# Patient Record
Sex: Female | Born: 1985 | Race: White | Hispanic: No | Marital: Married | Smoking: Never smoker
Health system: Southern US, Community
[De-identification: ages and names within clinical notes are randomized; demographics above are authoritative.]

## PROBLEM LIST (undated history)

## (undated) DIAGNOSIS — R110 Nausea: Secondary | ICD-10-CM

## (undated) DIAGNOSIS — F419 Anxiety disorder, unspecified: Secondary | ICD-10-CM

## (undated) DIAGNOSIS — Z9889 Other specified postprocedural states: Secondary | ICD-10-CM

## (undated) DIAGNOSIS — N939 Abnormal uterine and vaginal bleeding, unspecified: Secondary | ICD-10-CM

## (undated) DIAGNOSIS — G039 Meningitis, unspecified: Secondary | ICD-10-CM

## (undated) DIAGNOSIS — D259 Leiomyoma of uterus, unspecified: Secondary | ICD-10-CM

## (undated) DIAGNOSIS — N3010 Interstitial cystitis (chronic) without hematuria: Secondary | ICD-10-CM

## (undated) DIAGNOSIS — F411 Generalized anxiety disorder: Secondary | ICD-10-CM

## (undated) DIAGNOSIS — N8003 Adenomyosis of the uterus: Secondary | ICD-10-CM

## (undated) DIAGNOSIS — D696 Thrombocytopenia, unspecified: Secondary | ICD-10-CM

## (undated) DIAGNOSIS — M797 Fibromyalgia: Secondary | ICD-10-CM

## (undated) DIAGNOSIS — R102 Pelvic and perineal pain: Secondary | ICD-10-CM

## (undated) DIAGNOSIS — D649 Anemia, unspecified: Secondary | ICD-10-CM

## (undated) DIAGNOSIS — F32A Depression, unspecified: Secondary | ICD-10-CM

## (undated) DIAGNOSIS — Z973 Presence of spectacles and contact lenses: Secondary | ICD-10-CM

## (undated) DIAGNOSIS — F909 Attention-deficit hyperactivity disorder, unspecified type: Secondary | ICD-10-CM

## (undated) DIAGNOSIS — K580 Irritable bowel syndrome with diarrhea: Secondary | ICD-10-CM

## (undated) DIAGNOSIS — F329 Major depressive disorder, single episode, unspecified: Secondary | ICD-10-CM

## (undated) DIAGNOSIS — F988 Other specified behavioral and emotional disorders with onset usually occurring in childhood and adolescence: Secondary | ICD-10-CM

## (undated) DIAGNOSIS — K589 Irritable bowel syndrome without diarrhea: Secondary | ICD-10-CM

## (undated) HISTORY — DX: Irritable bowel syndrome, unspecified: K58.9

## (undated) HISTORY — DX: Nausea: R11.0

## (undated) HISTORY — DX: Pelvic and perineal pain: R10.2

## (undated) HISTORY — DX: Depression, unspecified: F32.A

## (undated) HISTORY — DX: Meningitis, unspecified: G03.9

## (undated) HISTORY — DX: Anxiety disorder, unspecified: F41.9

## (undated) HISTORY — DX: Other specified behavioral and emotional disorders with onset usually occurring in childhood and adolescence: F98.8

## (undated) HISTORY — DX: Interstitial cystitis (chronic) without hematuria: N30.10

## (undated) HISTORY — DX: Adenomyosis of the uterus: N80.03

---
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
## 2007-08-01 HISTORY — PX: COLONOSCOPY: SHX174

## 2010-07-31 HISTORY — PX: DIAGNOSTIC LAPAROSCOPY: SUR761

## 2012-07-31 DIAGNOSIS — N3010 Interstitial cystitis (chronic) without hematuria: Secondary | ICD-10-CM

## 2012-07-31 HISTORY — DX: Interstitial cystitis (chronic) without hematuria: N30.10

## 2012-08-05 ENCOUNTER — Other Ambulatory Visit: Payer: Self-pay | Admitting: Optometry

## 2012-08-05 DIAGNOSIS — H547 Unspecified visual loss: Secondary | ICD-10-CM

## 2012-08-08 ENCOUNTER — Ambulatory Visit
Admission: RE | Admit: 2012-08-08 | Discharge: 2012-08-08 | Disposition: A | Discharge status: Home / Self Care | Payer: BC Managed Care – PPO | Source: Ambulatory Visit | Attending: Optometry | Admitting: Optometry

## 2012-08-08 DIAGNOSIS — H547 Unspecified visual loss: Secondary | ICD-10-CM

## 2012-08-08 MED ORDER — GADOBENATE DIMEGLUMINE 529 MG/ML IV SOLN
6.0000 mL | Freq: Once | INTRAVENOUS | Status: AC | PRN
  Administered 2012-08-08: 6 mL via INTRAVENOUS

## 2012-10-18 ENCOUNTER — Telehealth: Payer: Self-pay | Admitting: Obstetrics & Gynecology

## 2012-10-18 NOTE — Telephone Encounter (Signed)
Spoke with patient-started prozac 10 mg, will increase to 20 mg this weekend. Has not heard from referral-patient aware will check on the status of this and call her on Monday.

## 2012-10-21 NOTE — Telephone Encounter (Signed)
Per Dr Ester Rink referral to Dr Loreta Ave

## 2012-10-21 NOTE — Telephone Encounter (Signed)
Yes.  She needs to be referred to Dr. Loreta Ave.

## 2012-10-21 NOTE — Telephone Encounter (Signed)
Please review paper chart, ? Referral to GI doctor

## 2012-10-22 NOTE — Telephone Encounter (Signed)
Patient aware of date and time for appt.with Dr. Loreta Ave. 10/29/2012 10:00am. pateint states has follow up appt. With Dr. Cleatis Polka in April also. sue

## 2012-10-22 NOTE — Telephone Encounter (Signed)
Left message on work and cell # of need to return call to get date and time of referral to Dr. Loreta Ave as ordered per Dr. Hyacinth Meeker. April 1st, 10:am. Fannie Knee /9:36am

## 2012-10-29 ENCOUNTER — Other Ambulatory Visit: Payer: Self-pay | Admitting: Gastroenterology

## 2012-10-29 DIAGNOSIS — R110 Nausea: Secondary | ICD-10-CM

## 2012-10-29 DIAGNOSIS — R10811 Right upper quadrant abdominal tenderness: Secondary | ICD-10-CM

## 2012-11-04 ENCOUNTER — Ambulatory Visit
Admission: RE | Admit: 2012-11-04 | Discharge: 2012-11-04 | Disposition: A | Discharge status: Home / Self Care | Payer: 59 | Source: Ambulatory Visit | Attending: Gastroenterology | Admitting: Gastroenterology

## 2012-11-04 DIAGNOSIS — R10811 Right upper quadrant abdominal tenderness: Secondary | ICD-10-CM

## 2012-11-04 DIAGNOSIS — R110 Nausea: Secondary | ICD-10-CM

## 2012-11-19 ENCOUNTER — Encounter: Payer: Self-pay | Admitting: Obstetrics & Gynecology

## 2012-11-19 ENCOUNTER — Ambulatory Visit (INDEPENDENT_AMBULATORY_CARE_PROVIDER_SITE_OTHER): Payer: 59 | Admitting: Obstetrics & Gynecology

## 2012-11-19 VITALS — BP 100/62 | HR 68 | Resp 16

## 2012-11-19 DIAGNOSIS — N946 Dysmenorrhea, unspecified: Secondary | ICD-10-CM

## 2012-11-19 DIAGNOSIS — R3915 Urgency of urination: Secondary | ICD-10-CM

## 2012-11-19 DIAGNOSIS — IMO0002 Reserved for concepts with insufficient information to code with codable children: Secondary | ICD-10-CM

## 2012-11-19 MED ORDER — FLUOXETINE HCL 20 MG PO CAPS: 20.0000 mg | ORAL_CAPSULE | Freq: Every day | ORAL | Status: DC

## 2012-11-19 NOTE — Progress Notes (Signed)
27 y.o. Married Caucasian female G0P0000 here for follow up of nausea.  Saw Dr. Loreta Ave and had an ultrasound.  Showed a small 1.3cm hemangioma in the right liver lobe.  Follow-up 4/29.  Was supposed to start a pro-biotic.  Hasn't started this yet but promises she will.  Will probably change appointment for a few more weeks.  Took Prozac about 3 weeks.  Was on 10mg  for two weeks and then 20mg  for one week.  Did fine on both 10 and 20mg  dosage but didn't feel like it helped and so she istopped.  She doesn't want to be on long-term medication.  I have expressed to her my concern that anxiety is a part of the many other symptoms she has, especially the GI symptoms.  Advised I really wanted her to be on 20mg  for 4 weeks and then 40mg  for 4 more weeks before she quit, unless she has unacceptable side effects.  Voices understanding.  Patient admitted for the first time that she is not compliant with OCPs and, therefore, has never had good success with them helping to control her cycles.  She failed Mirena use and is not interested in other progestin methods.  She is on Lysteda and this is helping.  She continues to feel that she may have endometriosis.  I have reviewed her laparoscopy from [place] with her personally and it was completely negative.  I have advised her that a repeat laparoscopy just to prove the first one was indeed correct is not recommended, in my very strong opinion.  We did discuss interstitial cystitis at her last visit.  She has done some reading and would like to discuss this further.  Diagnosis with KOH instillation vs cystoscopy discussed.  She really does not want to see a female provider if possible.  She and I discussed, again, that I will not prescribe narcotics for pain and she is in agreement with this.    O: Healthy WD,WN female Affect: normal  A:Menorrhagia, dysmenorrhea, dyspareunia Anxiety Poorly compliant with medication recommendations  P: Restart Prozac 20mg  and continue for at  least four weeks.  If no symptom improvement, increase to 40mg  qd for at least another month. Follow-up with Dr. Loreta Ave Return for I.C.testing   Labs   Instructions given regarding:

## 2012-11-19 NOTE — Patient Instructions (Addendum)
Jodi Adams will call and schedule the procedure to evaluate for I.C.

## 2012-11-20 ENCOUNTER — Encounter: Payer: Self-pay | Admitting: Obstetrics & Gynecology

## 2012-11-22 ENCOUNTER — Ambulatory Visit: Payer: Self-pay | Admitting: Obstetrics & Gynecology

## 2012-12-13 ENCOUNTER — Telehealth: Payer: Self-pay | Admitting: *Deleted

## 2012-12-13 NOTE — Telephone Encounter (Signed)
Call to patient to schedule IC testing.  Ins dept has not been able to reach patient.  Home/cell number VM has beeping and does not seem to record a message as the beeping continues while leaving message.  Call to work number and they say she is on vacation.  Lm on work VM number.

## 2012-12-17 ENCOUNTER — Telehealth: Payer: Self-pay | Admitting: *Deleted

## 2012-12-17 ENCOUNTER — Other Ambulatory Visit: Payer: Self-pay | Admitting: *Deleted

## 2012-12-17 NOTE — Telephone Encounter (Signed)
Patient returned call to [facility] in insurance dept regarding scheduling of IC testing.  Briefly explained procedure.  Scheduled for 12-26-12 with Dr Hyacinth Meeker.

## 2012-12-26 ENCOUNTER — Encounter: Payer: Self-pay | Admitting: Obstetrics & Gynecology

## 2012-12-26 ENCOUNTER — Ambulatory Visit (INDEPENDENT_AMBULATORY_CARE_PROVIDER_SITE_OTHER): Payer: 59 | Admitting: Obstetrics & Gynecology

## 2012-12-26 VITALS — BP 108/70 | HR 68 | Resp 16 | Wt 113.0 lb

## 2012-12-26 DIAGNOSIS — N9489 Other specified conditions associated with female genital organs and menstrual cycle: Secondary | ICD-10-CM | POA: Insufficient documentation

## 2012-12-26 DIAGNOSIS — N920 Excessive and frequent menstruation with regular cycle: Secondary | ICD-10-CM | POA: Insufficient documentation

## 2012-12-26 DIAGNOSIS — R350 Frequency of micturition: Secondary | ICD-10-CM

## 2012-12-26 DIAGNOSIS — N946 Dysmenorrhea, unspecified: Secondary | ICD-10-CM

## 2012-12-26 MED ORDER — AMITRIPTYLINE HCL 25 MG PO TABS: 25.0000 mg | ORAL_TABLET | Freq: Every day | ORAL | Status: DC

## 2012-12-26 NOTE — Progress Notes (Deleted)
Subjective:     Patient ID: Jodi Adams, female   DOB: 1986-04-23, 27 y.o.   MRN: [id number]  HPI   Review of Systems     Objective:   Physical Exam     Assessment:     ***    Plan:     ***

## 2012-12-26 NOTE — Patient Instructions (Addendum)
Start the Elavil as soon as you get it.  It can cause dry mouth, dizziness, drowsiness.  Take it in the evening.  Do not take if you have had alcohol at least during the first 30 days of use.  I want to see if you are going to have any side effects.

## 2012-12-26 NOTE — Progress Notes (Signed)
Procedure:  In-office Potassium Sensitivity  Pre-procedure PUFF test score: 20  Procedure has been explained to patient.  Risks/benefits discussed.  Consent form obtained.  Patient positioned in the dorsal lithotomy position.  Using sterile technique, the urethra was cleansed with Betadine solution.  8 Fr infant feeding tube was then inserted into the bladder and bladder was drained of all urine.  Urine culture sent:  yes.  Patient was advised to grade pain and urgency she currently feels before procedure began as "0" to establish a baseline.  Advised scoring during procedure for increased pain and urgency would be on a scale of 0-5.  Then "Test Solution #1" of 60ml of sterile water was slowly instilled into the bladder over 2-3 minutes.  Patient was asked to grade symptoms of pain and urgency with the instillation on a scale of 0-5.  After instillation, sterile water was drained completely. Permeability test results with Solution #1.  Pain: 4  Urgency: 2.  Both with gradual onset.  "Test Solution #2 of premixed potassium solution (10cc of KCl 36mEq/ml mixed with 40cc sterile water) instilled into the bladder slowly.  Patient was again asked to grade on scale of 0-5 symptoms of pain and urgency.   Permeability test results with Solution #1.  Pain: 5  Urgency: 5.  Both with immediate onset. Total Permeability Results:  +4  Rinse solution of 20cc sterile water was used to irrigate bladder.  Then "Rescue Solution" consisting of premixed solution of 100 of 2% Lidocaine with 4cc Sodium Bicarbonate (8.4%) with powder from inside two Elmiron capsules and 4cc sterile water was instilled into the bladder.  Catheter was withdrawn.  Patient was advised to not void for at least 15 minutes.  Patient tolerated procedure well.  Sterile water Exp: 3/15 KCL 37mEq/ml Lot [id number], Exp 12/14 Sodium bicarbonate 8.4% Lot [id number], Exp 2/15 2% Lidocaine Lot [id number], Exp 10/14 Elmiron capsules Lot [id number], Exp  12/12/13  Assessment:  Potassium sensitivity testing Positive for Interstitial Cystitis  Plan:  IC dietary changes discussed.  Sheet provided. Website information provided to help with elimination diet. Medications initiated:  Elavil 25 mg qhs.  Rx to pharmacy. Patient may need repeat rescue instillation.  She will give me an update in one week.  Will schedule at that time if appropriate.

## 2012-12-28 LAB — URINE CULTURE
Colony Count: NO GROWTH
Organism ID, Bacteria: NO GROWTH

## 2012-12-30 ENCOUNTER — Telehealth: Payer: Self-pay | Admitting: Obstetrics & Gynecology

## 2012-12-30 NOTE — Telephone Encounter (Signed)
Pt is checking on Rx that was to be sent to [facility] on [address] for IC? Phone number [phone].

## 2012-12-31 NOTE — Telephone Encounter (Signed)
Patient calling to confirm what pharmacy medication was sent to. Noted in EPIC medication sent to [facility]. Patient understood.

## 2013-01-09 ENCOUNTER — Ambulatory Visit (INDEPENDENT_AMBULATORY_CARE_PROVIDER_SITE_OTHER): Payer: 59 | Admitting: Obstetrics & Gynecology

## 2013-01-09 ENCOUNTER — Telehealth: Payer: Self-pay | Admitting: Nurse Practitioner

## 2013-01-09 VITALS — BP 98/60 | HR 72 | Resp 16 | Ht 61.75 in | Wt 113.2 lb

## 2013-01-09 DIAGNOSIS — N3010 Interstitial cystitis (chronic) without hematuria: Secondary | ICD-10-CM

## 2013-01-09 MED ORDER — CELECOXIB 200 MG PO CAPS: 200.0000 mg | ORAL_CAPSULE | Freq: Every day | ORAL | Status: DC

## 2013-01-09 NOTE — Telephone Encounter (Signed)
Patient is going to camp Saturday and is hesitant about having another round of Rescue solution. Please call to discuss options.

## 2013-01-09 NOTE — Telephone Encounter (Signed)
She also started amitriptyline.  How is she doing?  OK not to do another rescue solution but this is not the one that causes the pain.
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
## 2013-01-09 NOTE — Patient Instructions (Addendum)
Call after your trip and let me know how the celebrex worked

## 2013-01-09 NOTE — Telephone Encounter (Signed)
Routed to Dr. Hyacinth Meeker and Shirlyn Goltz

## 2013-01-15 ENCOUNTER — Encounter: Payer: Self-pay | Admitting: Obstetrics & Gynecology

## 2013-01-15 NOTE — Progress Notes (Signed)
27 y.o. Married Caucasian female G0P0000 here for follow up after starting amitriptyline.  She has positive testing for Interstitial Cystitis by office testing.  She is having significant issues with fatigue after taking the amitriptyline.  She has started taking it at night and cut it in 1/2 once but wasn't sure that was the correct thing to do.  She also wants to know if can repeat the rescue solution instillation.  She is getting ready to go on a week long trip and her cycle will be during this time.  Her most significant pain is with her cycle.  She has not tried to be sexually active yet.  D/w patient typical response to these bladder instillations and timing.  She has not tried to figure out yet if she has any triggers.    O: Healthy WD,WN female Affect: normal No other exam performed  A:Pelvic pain, dysmenorrhea, menorrhagia, I.C.  P: Will try Celebrex 200mg  qd while on cycle.  Samples given.

## 2013-01-28 ENCOUNTER — Ambulatory Visit: Payer: 59 | Admitting: Obstetrics & Gynecology

## 2013-01-28 NOTE — Telephone Encounter (Signed)
Medications ordered

## 2013-01-28 NOTE — Telephone Encounter (Signed)
Spoke with pt about how she did on Celebrex. Pt reports it took the edge off of the cramping pain, and was definitely the best anti inflammatory she's had. It did not make her groggy like a narcotic would have. However, camp was "not such a fun week" with a long bus ride on her period. Pt wondering when she should schedule another irrigation treatment. Pt was here 01-09-13 and did not have it done then because it had not been a full month since her last one. Please advise.

## 2013-01-28 NOTE — Telephone Encounter (Signed)
Spoke with pt to schedule her next rescue solution appt. Next available with SM is 02-05-13 at 4 pm. Pt agreeable.

## 2013-01-28 NOTE — Telephone Encounter (Signed)
Patient calling with questions about an appointment for "another injection treatment." Patient thought she had an appointment today but it was cancelled at checkout last visit.

## 2013-01-28 NOTE — Telephone Encounter (Signed)
Yes.  Can schedule now.  Can schedule in any procedure slot.    Kennon Rounds, do we have the medications for a rescue solution?

## 2013-02-05 ENCOUNTER — Encounter: Payer: Self-pay | Admitting: Obstetrics & Gynecology

## 2013-02-05 ENCOUNTER — Ambulatory Visit (INDEPENDENT_AMBULATORY_CARE_PROVIDER_SITE_OTHER): Payer: 59 | Admitting: Obstetrics & Gynecology

## 2013-02-05 VITALS — BP 100/60 | HR 92 | Temp 99.0°F | Ht 61.75 in | Wt 114.0 lb

## 2013-02-05 DIAGNOSIS — IMO0002 Reserved for concepts with insufficient information to code with codable children: Secondary | ICD-10-CM

## 2013-02-05 DIAGNOSIS — N9489 Other specified conditions associated with female genital organs and menstrual cycle: Secondary | ICD-10-CM

## 2013-02-05 DIAGNOSIS — N3010 Interstitial cystitis (chronic) without hematuria: Secondary | ICD-10-CM

## 2013-02-05 DIAGNOSIS — R5381 Other malaise: Secondary | ICD-10-CM

## 2013-02-05 MED ORDER — PENTOSAN POLYSULFATE SODIUM 100 MG PO CAPS: 100.0000 mg | ORAL_CAPSULE | Freq: Three times a day (TID) | ORAL | Status: DC

## 2013-02-05 MED ORDER — CELECOXIB 200 MG PO CAPS: 200.0000 mg | ORAL_CAPSULE | Freq: Every day | ORAL | Status: DC

## 2013-02-05 NOTE — Progress Notes (Signed)
Subjective:     Patient ID: Jodi Adams, female   DOB: 09-09-1985, 27 y.o.   MRN: [id number]  HPI 27 yo G0 MWF with severe dysmenorrhea and menorrhagia with possible IC here for bladder instillation with Lidocine/Elmiron solution.  She reports the Celebrex helped best of all for pain when she was on her cycle.  Still she feels the first three or so days or debilitating.  She has failed pills, IUD use.  Amytryptiline doesn't seem to help and is making her more tired.  She has been splitting them in 1/2.  She thinks coffee is a trigger but has refused to give up this.  She also wants to discuss her extreme fatigue.  She feels like she is 27 years old at times.  She has had multiple thyroid tests, CBC, Ferritin levels have all been normal.  She has no other specific complaints.  She would like additional testing.  Everything I have done, thus far, has been negative except for the IC testing.  Also, feels at time that her "vagina is closing up" right before intercourse.  She has significant pain with insertion.  I have felt that some of this is IC related but she may have vaginismus as well.  Can refer to PT for this.   Review of Systems  Constitutional: Positive for fatigue. Negative for fever, appetite change and unexpected weight change.  Respiratory: Negative for shortness of breath.   Cardiovascular: Negative for chest pain.  Genitourinary: Positive for urgency, frequency, vaginal bleeding and pelvic pain. Negative for vaginal discharge and vaginal pain.       Objective:   Physical Exam  Constitutional: She is oriented to person, place, and time. She appears well-developed and well-nourished.  Abdominal: Soft. Bowel sounds are normal.  Genitourinary: Vagina normal and uterus normal.  Musculoskeletal: Normal range of motion.  Neurological: She is alert and oriented to person, place, and time.  Skin: Skin is warm and dry.   Solution consisting of premixed solution of 100 of 2% Lidocaine (Lot  35-385 exp 08/10/13) with 4cc Sodium Bicarbonate (8.4%) (Lot [id number] -EV exp 08/31/13) with powder from inside two Elmiron capsules ([id number] exp 12/12/13) and 4cc sterile water was instilled into the bladder.  Catheter was withdrawn.  Patient was advised to not void for at least 30 minutes.  Patient tolerated procedure well.    Assessment:     Menorrhagia, Dysmenorrhea, Extreme fatigue, Dyspareunia, Vaginismsus     Plan:     Return one month for repeat bladder instillation. Refer to pelvic PT to work on vaginsmus Refer to Dr. Bedelia Person for further evaluation of fatigue

## 2013-02-12 ENCOUNTER — Telehealth: Payer: Self-pay | Admitting: Obstetrics & Gynecology

## 2013-02-12 NOTE — Telephone Encounter (Signed)
Patient notified of appointment referral to Ruben Gottron at [facility] for July 28th @ 11:00am. Patient aware that I called to schedule with the rheumatologist Dr. Corliss Skains for referral appointment per Dr. Hyacinth Meeker. Office number given for Dr. Corliss Skains states he has moved to another location at [facility].. Left message with scheduler x 2 to call our office for appointment. ; Last procedure visit 02/05/2013 with Dr. Hyacinth Meeker .Bladder instillation.

## 2013-02-12 NOTE — Telephone Encounter (Signed)
Patient calling to check status of referrals please.

## 2013-02-13 NOTE — Telephone Encounter (Signed)
LM on VM of pt scheduler Dottie about need for referral. LMTCB about faxing of records and appt.

## 2013-02-14 NOTE — Telephone Encounter (Signed)
Spoke with scheduler at [facility]. Requested info faxed to fax number given. Awaiting appt with Dr. Corliss Skains.

## 2013-03-11 ENCOUNTER — Ambulatory Visit (INDEPENDENT_AMBULATORY_CARE_PROVIDER_SITE_OTHER): Payer: 59 | Admitting: Obstetrics & Gynecology

## 2013-03-11 VITALS — BP 98/60 | HR 64 | Resp 12 | Ht 61.25 in | Wt 114.8 lb

## 2013-03-11 DIAGNOSIS — N9489 Other specified conditions associated with female genital organs and menstrual cycle: Secondary | ICD-10-CM

## 2013-03-11 DIAGNOSIS — R5381 Other malaise: Secondary | ICD-10-CM

## 2013-03-11 DIAGNOSIS — R5383 Other fatigue: Secondary | ICD-10-CM

## 2013-03-11 DIAGNOSIS — IMO0002 Reserved for concepts with insufficient information to code with codable children: Secondary | ICD-10-CM

## 2013-03-11 NOTE — Progress Notes (Signed)
Subjective:     Patient ID: Jodi Adams, female   DOB: February 25, 1986, 27 y.o.   MRN: [id number]  HPI 27 yo G0 MWF with severe dysmenorrhea and menorrhagia with possible IC here for bladder instillation with Lidocine/Elmiron solution.  Still feels celebrex helps but that the pain during her cycle is almost debilitating.  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  Hasn't taken much of the Elmiron.  Lost the bottle of medication but recently found it.  Really happy with referral to pelvic PT.  Feels this is going to help most of all.   She continues to feel there is some problem that has not been "treated" yet--like endometriosis.  We have discussed her prior laparoscopy in the past.  I do feel that the report she was given and the actual operative note do not seem to agree.  We discussed risks/benefits of repeating.  She does realize there is a change this could all be negative and that she accepts the risks of this procedure knowing this.  Bleeding, infection, bowel/bladder/ureteral injury/DVT and PE all discussed.  She will check schedule and consider.   Review of Systems  Constitutional: Positive for fatigue. Negative for fever, appetite change and unexpected weight change.  Respiratory: Negative for shortness of breath.   Cardiovascular: Negative for chest pain.  Genitourinary: Positive for urgency, frequency and pelvic pain. Negative for vaginal discharge and vaginal pain.       Objective:   Physical Exam  Constitutional: She is oriented to person, place, and time. She appears well-developed and well-nourished.  Abdominal: Soft. Bowel sounds are normal.  Genitourinary: Vagina normal and uterus normal.  Musculoskeletal: Normal range of motion.  Neurological: She is alert and oriented to person, place, and time.  Skin: Skin is warm and dry.   Solution consisting of premixed solution of 100 of 2% Lidocaine (Lot 35-385 exp  08/10/13) with 4cc Sodium Bicarbonate (8.4%) (Lot [id number] exp 08/31/13) with powder from inside two Elmiron capsules ([id number] exp 12/12/13) and 4cc sterile water was instilled into the bladder.  Catheter was withdrawn.  Patient was advised to not void for at least 30 minutes.  Patient tolerated procedure well.    Assessment:     Menorrhagia, Dysmenorrhea, Extreme fatigue, Dyspareunia, Vaginismsus     Plan:     Possible return one month for repeat bladder instillation.  If no improvement after this instillation, will stop. Continue with pelvic PT. Has appt with Dr. Bedelia Person in September. Consider diagnostic laparoscopy

## 2013-03-27 ENCOUNTER — Ambulatory Visit: Payer: 59 | Admitting: Obstetrics & Gynecology

## 2013-04-08 ENCOUNTER — Telehealth: Payer: Self-pay | Admitting: Obstetrics & Gynecology

## 2013-04-08 NOTE — Telephone Encounter (Signed)
Spoke with pt who inadvertently missed an appt with SM and wanted to apologize. She also went to appt with Dr. Corliss Skains as scheduled, but when she got there, they said they did not have any records on her. Advised pt I would call and check.  Pt to call their office back to get new appt.   Found original pt info faxed in July to their office prior to appt. Call to Dr. Fatima Sanger office to clarify fax number. [phone]. Number correct. Refaxed pt info.

## 2013-04-08 NOTE — Telephone Encounter (Signed)
Patient is calling about referral from Dr. Hyacinth Meeker. Please call, she missed her last appointment with Dr. Hyacinth Meeker. Patient  Says this appointment was contingent on the referral appointment. Patient doesn't want to reschedule at this time.

## 2013-08-21 ENCOUNTER — Telehealth: Payer: Self-pay | Admitting: Obstetrics & Gynecology

## 2013-08-21 DIAGNOSIS — IMO0002 Reserved for concepts with insufficient information to code with codable children: Secondary | ICD-10-CM

## 2013-08-21 NOTE — Telephone Encounter (Signed)
Patient is having irregular periods and would like an appointment with Dr. Hyacinth MeekerMiller.

## 2013-08-21 NOTE — Telephone Encounter (Signed)
Call to patient who requests appointment with Dr Hyacinth MeekerMiller to discuss cycles, (2 this month) possible attempt for pregnancy, IC and pregnancy concerns, and if she still needs a surgical procedure. Patient is unsure if time for her AEX. Advised since she has several issues to discuss, schedule OV first and she can review with Dr Hyacinth MeekerMiller and then we can schedule AEX based on whatever follow-up Dr Hyacinth MeekerMiller recommends. Patient agreeable OV scheduled for 08-26-13 at 1115.  Patient has been seen several times over the last year for problem visits but no recent AEX seen.  Pre-EPIC chart to your office.  Agree with schedule or any changes?

## 2013-08-22 NOTE — Telephone Encounter (Signed)
This is fine.  Encounter closed. 

## 2013-08-26 ENCOUNTER — Ambulatory Visit (INDEPENDENT_AMBULATORY_CARE_PROVIDER_SITE_OTHER): Payer: Managed Care, Other (non HMO) | Admitting: Obstetrics & Gynecology

## 2013-08-26 VITALS — BP 96/60 | HR 68 | Resp 16 | Ht 61.75 in | Wt 116.2 lb

## 2013-08-26 DIAGNOSIS — R5383 Other fatigue: Secondary | ICD-10-CM

## 2013-08-26 DIAGNOSIS — R5381 Other malaise: Secondary | ICD-10-CM

## 2013-08-26 DIAGNOSIS — Z319 Encounter for procreative management, unspecified: Secondary | ICD-10-CM

## 2013-08-26 DIAGNOSIS — Z0184 Encounter for antibody response examination: Secondary | ICD-10-CM

## 2013-08-26 DIAGNOSIS — Z124 Encounter for screening for malignant neoplasm of cervix: Secondary | ICD-10-CM

## 2013-08-26 DIAGNOSIS — N946 Dysmenorrhea, unspecified: Secondary | ICD-10-CM

## 2013-08-26 DIAGNOSIS — IMO0002 Reserved for concepts with insufficient information to code with codable children: Secondary | ICD-10-CM

## 2013-08-26 DIAGNOSIS — Z01419 Encounter for gynecological examination (general) (routine) without abnormal findings: Secondary | ICD-10-CM

## 2013-08-26 LAB — TSH: TSH: 1.218 u[IU]/mL (ref 0.350–4.500)

## 2013-08-26 LAB — SEDIMENTATION RATE: Sed Rate: 3 mm/hr (ref 0–22)

## 2013-08-26 LAB — CK: Total CK: 50 U/L (ref 7–177)

## 2013-08-26 LAB — RHEUMATOID FACTOR: Rhuematoid fact SerPl-aCnc: <10 IU/mL (ref <=14)

## 2013-08-26 NOTE — Progress Notes (Signed)
28 y.o. G0P0000 Married CaucasianF here for annual exam.  Did see Dr. Corliss Skains in October.  Didn't do blood work but brought lab order form that was given to her at that visit.  Pt reports she was diagnosed with fibromyalgia but patient states she thought Dr. Corliss Skains doesn't even believe in that diagnosis.  Again, didn't do the labs that Dr. Corliss Skains recommended.  We can do them here today.    Patient has multiple complaints, which is typical for her visits.  She reports she has significant fatigue.  She makes the decision the either work or exercise but feels she is just too tired to do both.  This is partly why I sent her to Dr. Bedelia Person but she felt that appointment was useless.  Reports she went to yoga last week but she stopped mid-class because she hurt so much.  She did a hot yoga class.  She talked with the instructor and the instructor told her she needed to be in a therapeutic yoga class.  Pt has called to check if any classes are covered by insurance but hasn't found one yet.  Patient did see Amalia Greenhouse at [facility] around six times.  Didn't feel it made any improvement in her pain.  Amalia Greenhouse recommended a sex therapist but Lutisha hasn't gone.  She said it costs too much.  She can't remember the name of the person that Amalia Greenhouse advised she and her husband seen.  Husband continues to have erectile dysfunction/performance anxiety.  He saw urologist about two years ago for the first visit.  Labs were done.  Patient reports he has low testosterone.  Medication was too expensive.  He saw the urologist again about six months ago and testosterone levels were better.  Urologist gave samples for viagra and cialis.  Husband hasn't masturbated in years and when it did happen it took an extensive amount of time.  Also, patient wants to discuss pregnancy.     Patient's last menstrual period was 08/21/2013.          Sexually active: yes  The current method of family planning is abstinence.     Exercising: no Smoker:  no  Health Maintenance: Pap:  12/11 History of abnormal Pap:  no MMG:  n/a Colonoscopy:  n/a BMD:   n/a TDaP:  2010? Screening Labs: today, Hb today: today, Urine today: today   reports that she has never smoked. She does not have any smokeless tobacco history on file.  Past Medical History  Diagnosis Date  . Anxiety   . Chronic nausea   . Pelvic pain   . IBS (irritable bowel syndrome)     ulcers  . Meningitis 2005    encephalitis    Past Surgical History  Procedure Laterality Date  . Diagnostic laparoscopy  2012    negative endo and hysteroscopy    Current Outpatient Prescriptions  Medication Sig Dispense Refill  . celecoxib (CELEBREX) 200 MG capsule Take 1 capsule (200 mg total) by mouth daily.  30 capsule  1  . tranexamic acid (LYSTEDA) 650 MG TABS Take 650 mg by mouth as needed.       . pentosan polysulfate (ELMIRON) 100 MG capsule Take 1 capsule (100 mg total) by mouth 3 (three) times daily before meals.  30 capsule  3   No current facility-administered medications for this visit.    Family History  Problem Relation Age of Onset  . Hypertension Father   . Heart disease Father   . Thyroid disease  Father   . Hypertension Brother   . Breast cancer Maternal Grandmother   . Diabetes Maternal Grandfather   . Hypertension Maternal Grandfather   . Heart disease Maternal Grandfather   . Breast cancer Paternal Grandmother   . Bipolar disorder Mother   . Depression Maternal Grandmother     manic    ROS:  Pertinent items are noted in HPI.  Otherwise, a comprehensive ROS was negative.  Exam:   BP 96/60  Pulse 68  Resp 16  Ht 5' 1.75" (1.568 m)  Wt 116 lb 3.2 oz (52.708 kg)  BMI 21.44 kg/m2  LMP 08/21/2013     Height: 5' 1.75" (156.8 cm)  Ht Readings from Last 3 Encounters:  08/26/13 5' 1.75" (1.568 m)  03/11/13 5' 1.25" (1.556 m)  02/05/13 5' 1.75" (1.568 m)    General appearance: alert, cooperative and appears stated  age Head: Normocephalic, without obvious abnormality, atraumatic Neck: no adenopathy, supple, symmetrical, trachea midline and thyroid normal to inspection and palpation Lungs: clear to auscultation bilaterally Breasts: normal appearance, no masses or tenderness Heart: regular rate and rhythm Abdomen: soft, non-tender; bowel sounds normal; no masses,  no organomegaly Extremities: extremities normal, atraumatic, no cyanosis or edema Skin: Skin color, texture, turgor normal. No rashes or lesions Lymph nodes: Cervical, supraclavicular, and axillary nodes normal. No abnormal inguinal nodes palpated Neurologic: Grossly normal   Pelvic: External genitalia:  no lesions              Urethra:  normal appearing urethra with no masses, tenderness or lesions              Bartholins and Skenes: normal                 Vagina: normal appearing vagina with normal color and discharge, no lesions              Cervix: no lesions              Pap taken: no Bimanual Exam:  Uterus:  normal size, contour, position, consistency, mobility, non-tender              Adnexa: normal adnexa and no mass, fullness, tenderness               Rectovaginal: Confirms               Anus:  normal sphincter tone, no lesions  A:  Well Woman with normal exam Dyspareunia.  W/U has included [place] 2 negative colonoscopies, no help after treatment for IC, improvement after pelvic PT but pt not going now due to cost Fatigue--pt did not do labs recommended by Dr. Myra Rudeevenshwar--will do today. Spouse with erectile dysfuntion vs. Performance anxiety Menorrhagia/dysmenorrhea--improved with celbrex 200mg  that she takes BID.  No rx needed.  Lysteda did help some.  Failed OCPs and IUD Desires pregnancy  P:   Mammogram starting age 640 Recommended started PNV Will make consultation with [facility] infertility [facility] Rubella titer today Labs from Dr. Aloha Gellevenshwar's orders pap smear  obtained. return annually or prn  Extensive visit with patient today.  >1 hour spent with her.  Exam performed and discussion of above occurred.  An After Visit Summary was printed and given to the patient.

## 2013-08-27 LAB — RUBELLA SCREEN: Rubella: 3.42 Index — ABNORMAL HIGH (ref <0.90)

## 2013-08-27 LAB — ANA: Anti Nuclear Antibody(ANA): NEGATIVE

## 2013-08-27 LAB — IPS PAP TEST WITH REFLEX TO HPV

## 2013-08-29 ENCOUNTER — Encounter: Payer: Self-pay | Admitting: Obstetrics & Gynecology

## 2013-09-05 NOTE — Addendum Note (Signed)
Addended by: Joeseph AmorFAST, Rickardo Brinegar L on: 09/05/2013 03:46 PM   Modules accepted: Orders

## 2013-09-30 ENCOUNTER — Telehealth: Payer: Self-pay | Admitting: Obstetrics & Gynecology

## 2013-09-30 NOTE — Telephone Encounter (Signed)
Patient is scheduled with Dr. April MansonYalcinkaya 03.25 at 1

## 2013-10-20 ENCOUNTER — Telehealth: Payer: Self-pay | Admitting: Obstetrics & Gynecology

## 2013-10-20 NOTE — Telephone Encounter (Signed)
Patient calling to see which fertility clinic she was referred to. She has lost the information.

## 2013-10-20 NOTE — Telephone Encounter (Signed)
Spoke with patient. Information [facility] with Dr. April MansonYalcinkaya was given. Patient states that she thought she had an appointment for 3/25 at 1300 with their office but upon calling they state they never received referral. Called [facility] and left message to call back regarding referral and scheduling appointment for patient. Advised patient would call back with details for appointment.   Spoke with patient. Confusion might be with patients two last names. Patient will call Dr.Yalcinkaya's office to see if this may be the confusion. Advised to call back if there is still a problem.  [phone] Dr.Yalcinkaya [facility] Faxed referral 09/04/2013 Referral number [id number] Patient states she is free all next week if appointment possible. If not next week early morning between 9:30 and 12:30 work best.

## 2018-08-27 DIAGNOSIS — R7302 Impaired glucose tolerance (oral): Secondary | ICD-10-CM | POA: Insufficient documentation

## 2018-08-27 DIAGNOSIS — F419 Anxiety disorder, unspecified: Secondary | ICD-10-CM | POA: Insufficient documentation

## 2018-08-27 DIAGNOSIS — N3010 Interstitial cystitis (chronic) without hematuria: Secondary | ICD-10-CM | POA: Insufficient documentation

## 2018-08-27 DIAGNOSIS — F32A Depression, unspecified: Secondary | ICD-10-CM | POA: Insufficient documentation

## 2018-08-27 DIAGNOSIS — F329 Major depressive disorder, single episode, unspecified: Secondary | ICD-10-CM | POA: Insufficient documentation

## 2018-09-06 DIAGNOSIS — E559 Vitamin D deficiency, unspecified: Secondary | ICD-10-CM | POA: Insufficient documentation

## 2019-01-23 ENCOUNTER — Other Ambulatory Visit: Payer: Self-pay

## 2019-01-23 ENCOUNTER — Encounter: Payer: Self-pay | Admitting: Family Medicine

## 2019-01-23 ENCOUNTER — Ambulatory Visit (INDEPENDENT_AMBULATORY_CARE_PROVIDER_SITE_OTHER): Payer: 59 | Admitting: Family Medicine

## 2019-01-23 VITALS — BP 110/62 | HR 97 | Temp 98.3°F | Wt 134.7 lb

## 2019-01-23 DIAGNOSIS — L259 Unspecified contact dermatitis, unspecified cause: Secondary | ICD-10-CM

## 2019-01-23 MED ORDER — DRYSOL 20 % EX SOLN: Freq: Every day | CUTANEOUS | 1 refills | Status: DC

## 2019-01-23 NOTE — Patient Instructions (Signed)
Contact Dermatitis  Dermatitis is redness, soreness, and swelling (inflammation) of the skin. Contact dermatitis is a reaction to certain substances that touch the skin. Many different substances can cause contact dermatitis. There are two types of contact dermatitis:   Irritant contact dermatitis. This type is caused by something that irritates your skin, such as having dry hands from washing them too often with soap. This type does not require previous exposure to the substance for a reaction to occur. This is the most common type.   Allergic contact dermatitis. This type is caused by a substance that you are allergic to, such as poison ivy. This type occurs when you have been exposed to the substance (allergen) and develop a sensitivity to it. Dermatitis may develop soon after your first exposure to the allergen, or it may not develop until the next time you are exposed and every time thereafter.  What are the causes?  Irritant contact dermatitis is most commonly caused by exposure to:   Makeup.   Soaps.   Detergents.   Bleaches.   Acids.   Metal salts, such as nickel.  Allergic contact dermatitis is most commonly caused by exposure to:   Poisonous plants.   Chemicals.   Jewelry.   Latex.   Medicines.   Preservatives in products, such as clothing.  What increases the risk?  You are more likely to develop this condition if you have:   A job that exposes you to irritants or allergens.   Certain medical conditions, such as asthma or eczema.  What are the signs or symptoms?  Symptoms of this condition may occur on your body anywhere the irritant has touched you or is touched by you.   Symptoms include:  ? Dryness or flaking.  ? Redness.  ? Cracks.  ? Itching.  ? Pain or a burning feeling.  ? Blisters.  ? Drainage of small amounts of blood or clear fluid from skin cracks.  With allergic contact dermatitis, there may also be swelling in areas such as the eyelids, mouth, or genitals.  How is this  diagnosed?  This condition is diagnosed with a medical history and physical exam.   A patch skin test may be performed to help determine the cause.   If the condition is related to your job, you may need to see an occupational medicine specialist.  How is this treated?  This condition is treated by checking for the cause of the reaction and protecting your skin from further contact. Treatment may also include:   Steroid creams or ointments. Oral steroid medicines may be needed in more severe cases.   Antibiotic medicines or antibacterial ointments, if a skin infection is present.   Antihistamine lotion or an antihistamine taken by mouth to ease itching.   A bandage (dressing).  Follow these instructions at home:  Skin care   Moisturize your skin as needed.   Apply cool compresses to the affected areas.   Try applying baking soda paste to your skin. Stir water into baking soda until it reaches a paste-like consistency.   Do not scratch your skin, and avoid friction to the affected area.   Avoid the use of soaps, perfumes, and dyes.  Medicines   Take or apply over-the-counter and prescription medicines only as told by your health care provider.   If you were prescribed an antibiotic medicine, take or apply the antibiotic as told by your health care provider. Do not stop using the antibiotic even if your condition   improves.  Bathing   Try taking a bath with:  ? Epsom salts. Follow the instructions on the packaging. You can get these at your local pharmacy or grocery store.  ? Baking soda. Pour a small amount into the bath as directed by your health care provider.  ? Colloidal oatmeal. Follow the instructions on the packaging. You can get this at your local pharmacy or grocery store.   Bathe less frequently, such as every other day.   Bathe in lukewarm water. Avoid using hot water.  Bandage care   If you were given a bandage (dressing), change it as told by your health care provider.   Wash your hands  with soap and water before and after you change your dressing. If soap and water are not available, use hand sanitizer.  General instructions   Avoid the substance that caused your reaction. If you do not know what caused it, keep a journal to try to track what caused it. Write down:  ? What you eat.  ? What cosmetic products you use.  ? What you drink.  ? What you wear in the affected area. This includes jewelry.   Check the affected areas every day for signs of infection. Check for:  ? More redness, swelling, or pain.  ? More fluid or blood.  ? Warmth.  ? Pus or a bad smell.   Keep all follow-up visits as told by your health care provider. This is important.  Contact a health care provider if:   Your condition does not improve with treatment.   Your condition gets worse.   You have signs of infection such as swelling, tenderness, redness, soreness, or warmth in the affected area.   You have a fever.   You have new symptoms.  Get help right away if:   You have a severe headache, neck pain, or neck stiffness.   You vomit.   You feel very sleepy.   You notice red streaks coming from the affected area.   Your bone or joint underneath the affected area becomes painful after the skin has healed.   The affected area turns darker.   You have difficulty breathing.  Summary   Dermatitis is redness, soreness, and swelling (inflammation) of the skin. Contact dermatitis is a reaction to certain substances that touch the skin.   Symptoms of this condition may occur on your body anywhere the irritant has touched you or is touched by you.   This condition is treated by figuring out what caused the reaction and protecting your skin from further contact. Treatment may also include medicines and skin care.   Avoid the substance that caused your reaction. If you do not know what caused it, keep a journal to try to track what caused it.   Contact a health care provider if your condition gets worse or you have signs  of infection such as swelling, tenderness, redness, soreness, or warmth in the affected area.  This information is not intended to replace advice given to you by your health care provider. Make sure you discuss any questions you have with your health care provider.  Document Released: 07/14/2000 Document Revised: 01/30/2018 Document Reviewed: 01/30/2018  Elsevier Interactive Patient Education  2019 Elsevier Inc[employer]

## 2019-01-23 NOTE — Progress Notes (Signed)
  Subjective:     Patient ID: Jodi Adams, female   DOB: 04/29/1986, 33 y.o.   MRN: [id number]  HPI   Jodi Adams is seen with rash involving both lower extremities which started about a week ago.  She noticed what she initially thought was a bite on her right thigh but since then has had some blistery pruritic patches on both legs and thighs.  She has sparing of the head, neck, trunk, and upper extremities.  Her rash seems to be spreading.  She is [redacted] weeks pregnant and waiting to establish with her GYN.  She did a telemedicine visit and was given some cortisone cream which has not helped much.   Past Medical History:  Diagnosis Date  . Anxiety   . Chronic nausea   . IBS (irritable bowel syndrome)    ulcers  . Meningitis 2005   encephalitis  . Pelvic pain    Past Surgical History:  Procedure Laterality Date  . DIAGNOSTIC LAPAROSCOPY  2012   negative endo and hysteroscopy    reports that she has never smoked. She does not have any smokeless tobacco history on file. No history on file for alcohol and drug. family history includes Bipolar disorder in her mother; Breast cancer in her maternal grandmother and paternal grandmother; Depression in her maternal grandmother; Diabetes in her maternal grandfather; Heart disease in her father and maternal grandfather; Hypertension in her brother, father, and maternal grandfather; Thyroid disease in her father. Allergies  Allergen Reactions  . Ceclor [Cefaclor]   . Lactose Intolerance (Gi)      Review of Systems  Constitutional: Negative for chills and fever.  Respiratory: Negative for shortness of breath.   Cardiovascular: Negative for chest pain.  Skin: Positive for rash.       Objective:   Physical Exam Constitutional:      Appearance: Normal appearance.  Cardiovascular:     Rate and Rhythm: Normal rate and regular rhythm.  Skin:    Findings: Rash present.     Comments: Patient has several scattered vesicles and patches involving both  legs as well as thighs.  Sparing of arms /upper extremities and trunk  Neurological:     Mental Status: She is alert.        Assessment:     Vesicular rash lower extremities which looks very compatible with contact type dermatitis.    Plan:     -We were reluctant to use systemic steroids given the fact that she is in first trimester -She will try topical measures such as cortisone cream.  She could try some over-the-counter or prescription Drysol to see if this helps as an astringent since these are weeping in several areas -She is aware this may take several weeks to fully resolve -Follow-up for any signs of secondary infection  Eulas Post MD [facility]

## 2019-01-27 ENCOUNTER — Other Ambulatory Visit: Payer: Self-pay

## 2019-01-27 ENCOUNTER — Ambulatory Visit (INDEPENDENT_AMBULATORY_CARE_PROVIDER_SITE_OTHER): Payer: 59 | Admitting: Family Medicine

## 2019-01-27 ENCOUNTER — Encounter: Payer: Self-pay | Admitting: Family Medicine

## 2019-01-27 DIAGNOSIS — K589 Irritable bowel syndrome without diarrhea: Secondary | ICD-10-CM

## 2019-01-27 DIAGNOSIS — R21 Rash and other nonspecific skin eruption: Secondary | ICD-10-CM

## 2019-01-27 DIAGNOSIS — R238 Other skin changes: Secondary | ICD-10-CM

## 2019-01-27 NOTE — Progress Notes (Signed)
Patient ID: Jodi Adams, female   DOB: [birth year], 33 y.o.   MRN: [id number]  This visit type was conducted due to national recommendations for restrictions regarding the COVID-19 pandemic in an effort to limit this patient's exposure and mitigate transmission in our community.   Virtual Visit via Video Note  I connected with Jodi Adams on 01/27/19 at  9:00 AM EDT by a video enabled telemedicine application and verified that I am speaking with the correct person using two identifiers.  Location patient: home Location provider:work or home office Persons participating in the virtual visit: patient, provider  I discussed the limitations of evaluation and management by telemedicine and the availability of in person appointments. The patient expressed understanding and agreed to proceed.   HPI:  Patient was set up for establishing care with [place].  We actually saw her as a work in last week with vesicular rash on her lower extremities.  This had appearance of possible contact dermatitis though she is not aware of any exposures.  She has developed a couple new areas and rash seem to be worse when she is in the heat.  She has pruritus.  No pain.  No fever.  She was just diagnosed with pregnancy and is in first trimester so we elected to avoid prednisone use.  Past medical history reviewed.  She states she was diagnosed with IBS.  She has had some chronic myalgia pains and has had extensive work-up previously.  IBS symptoms are currently fairly stable.  Family history reviewed.  Her father apparently had A. fib.  Mother bipolar disorder.  Brother with hypertension.  No family history of premature CAD.  Past Medical History:  Diagnosis Date  . Anxiety   . Chronic nausea   . IBS (irritable bowel syndrome)    ulcers  . Meningitis 2005   encephalitis  . Pelvic pain    Past Surgical History:  Procedure Laterality Date  . DIAGNOSTIC LAPAROSCOPY  2012   negative endo and hysteroscopy    reports that she has never smoked. She does not have any smokeless tobacco history on file. No history on file for alcohol and drug. family history includes Bipolar disorder in her mother; Breast cancer in her maternal grandmother and paternal grandmother; Depression in her maternal grandmother; Diabetes in her maternal grandfather; Heart disease in her father and maternal grandfather; Hypertension in her brother, father, and maternal grandfather; Thyroid disease in her father. Allergies  Allergen Reactions  . Ceclor [Cefaclor]   . Lactose Intolerance (Gi)       ROS: See pertinent positives and negatives per HPI.  Past Medical History:  Diagnosis Date  . Anxiety   . Chronic nausea   . IBS (irritable bowel syndrome)    ulcers  . Meningitis 2005   encephalitis  . Pelvic pain     Past Surgical History:  Procedure Laterality Date  . DIAGNOSTIC LAPAROSCOPY  2012   negative endo and hysteroscopy    Family History  Problem Relation Age of Onset  . Hypertension Father   . Heart disease Father   . Thyroid disease Father   . Hypertension Brother   . Breast cancer Maternal Grandmother   . Depression Maternal Grandmother        manic  . Diabetes Maternal Grandfather   . Hypertension Maternal Grandfather   . Heart disease Maternal Grandfather   . Breast cancer Paternal Grandmother   . Bipolar disorder Mother     SOCIAL HX: Non-smoker   Current Outpatient  Medications:  .  aluminum chloride (DRYSOL) 20 % external solution, Apply topically at bedtime., Disp: 60 mL, Rfl: 1 .  pentosan polysulfate (ELMIRON) 100 MG capsule, Take 1 capsule (100 mg total) by mouth 3 (three) times daily before meals., Disp: 30 capsule, Rfl: 3 .  tranexamic acid (LYSTEDA) 650 MG TABS, Take 650 mg by mouth as needed. , Disp: , Rfl:   EXAM:  VITALS per patient if applicable:  GENERAL: alert, oriented, appears well and in no acute distress  HEENT: atraumatic, conjunttiva clear, no obvious  abnormalities on inspection of external nose and ears  NECK: normal movements of the head and neck  LUNGS: on inspection no signs of respiratory distress, breathing rate appears normal, no obvious gross SOB, gasping or wheezing  CV: no obvious cyanosis  MS: moves all visible extremities without noticeable abnormality  PSYCH/NEURO: pleasant and cooperative, no obvious depression or anxiety, speech and thought processing grossly intact  ASSESSMENT AND PLAN:  Discussed the following assessment and plan:  #1 skin rash confined to the lower extremities.  This is vesicular and pruritic and appears to be more allergic.  Avoiding steroids because she is in first trimester -Consider skin biopsy if she continues to get a new lesions -No plaque-like lesions and doubt pemphigoid gestationalis given distribution only of legs  #2 reported history of IBS.  Currently stable symptomatically    I discussed the assessment and treatment plan with the patient. The patient was provided an opportunity to ask questions and all were answered. The patient agreed with the plan and demonstrated an understanding of the instructions.   The patient was advised to call back or seek an in-person evaluation if the symptoms worsen or if the condition fails to improve as anticipated.   Evelena PeatBruce Eliot Bencivenga, MD

## 2019-02-25 LAB — OB RESULTS CONSOLE HEPATITIS B SURFACE ANTIGEN: Hepatitis B Surface Ag: NEGATIVE

## 2019-02-25 LAB — OB RESULTS CONSOLE RUBELLA ANTIBODY, IGM: Rubella: IMMUNE

## 2019-02-25 LAB — OB RESULTS CONSOLE RPR: RPR: NONREACTIVE

## 2019-02-25 LAB — OB RESULTS CONSOLE GC/CHLAMYDIA
Chlamydia: NEGATIVE
Gonorrhea: NEGATIVE

## 2019-02-25 LAB — OB RESULTS CONSOLE HIV ANTIBODY (ROUTINE TESTING): HIV: NONREACTIVE

## 2019-02-25 LAB — OB RESULTS CONSOLE ABO/RH: RH Type: POSITIVE

## 2019-02-25 LAB — OB RESULTS CONSOLE ANTIBODY SCREEN: Antibody Screen: NEGATIVE

## 2019-04-18 ENCOUNTER — Encounter: Payer: Self-pay | Admitting: Family Medicine

## 2019-04-18 ENCOUNTER — Telehealth (INDEPENDENT_AMBULATORY_CARE_PROVIDER_SITE_OTHER): Payer: 59 | Admitting: Family Medicine

## 2019-04-18 VITALS — Temp 97.1°F | Ht 61.75 in | Wt 135.0 lb

## 2019-04-18 DIAGNOSIS — J019 Acute sinusitis, unspecified: Secondary | ICD-10-CM

## 2019-04-18 MED ORDER — AMOXICILLIN 875 MG PO TABS
875.0000 mg | ORAL_TABLET | Freq: Two times a day (BID) | ORAL | 0 refills | Status: DC
Start: 2019-04-18 — End: 2019-09-17

## 2019-04-18 NOTE — Progress Notes (Signed)
Virtual Visit via Video Note  I connected with the patient on 04/18/19 at  9:45 AM EDT by a video enabled telemedicine application and verified that I am speaking with the correct person using two identifiers.  Location patient: home Location provider:work or home office Persons participating in the virtual visit: patient, provider  I discussed the limitations of evaluation and management by telemedicine and the availability of in person appointments. The patient expressed understanding and agreed to proceed.   HPI: Here for 3 days of sinus congestion, PND, ST, and coughing up yellow sputum. The coughing makes her gag and she has vomited a few times, but she denies nausea. No fever or headache. No chest pain or SOB. No change in taste or smell. No abdominal pain or body aches. Drinking fluids and taking Tylenol. She is pregnant.    ROS: See pertinent positives and negatives per HPI.  Past Medical History:  Diagnosis Date  . Anxiety   . Chronic nausea   . IBS (irritable bowel syndrome)    ulcers  . Meningitis 2005   encephalitis  . Pelvic pain     Past Surgical History:  Procedure Laterality Date  . DIAGNOSTIC LAPAROSCOPY  2012   negative endo and hysteroscopy    Family History  Problem Relation Age of Onset  . Hypertension Father   . Heart disease Father   . Thyroid disease Father   . Hypertension Brother   . Breast cancer Maternal Grandmother   . Depression Maternal Grandmother        manic  . Diabetes Maternal Grandfather   . Hypertension Maternal Grandfather   . Heart disease Maternal Grandfather   . Breast cancer Paternal Grandmother   . Bipolar disorder Mother     No current outpatient medications on file.  EXAM:  VITALS per patient if applicable:  GENERAL: alert, oriented, appears well and in no acute distress  HEENT: atraumatic, conjunttiva clear, no obvious abnormalities on inspection of external nose and ears  NECK: normal movements of the head and  neck  LUNGS: on inspection no signs of respiratory distress, breathing rate appears normal, no obvious gross SOB, gasping or wheezing  CV: no obvious cyanosis  MS: moves all visible extremities without noticeable abnormality  PSYCH/NEURO: pleasant and cooperative, no obvious depression or anxiety, speech and thought processing grossly intact  ASSESSMENT AND PLAN: Sinusitis, treat with Amoxicillin.  Alysia Penna, MD  Discussed the following assessment and plan:  No diagnosis found.     I discussed the assessment and treatment plan with the patient. The patient was provided an opportunity to ask questions and all were answered. The patient agreed with the plan and demonstrated an understanding of the instructions.   The patient was advised to call back or seek an in-person evaluation if the symptoms worsen or if the condition fails to improve as anticipated.

## 2019-04-18 NOTE — Addendum Note (Signed)
Addended by: Alysia Penna A on: 04/18/2019 10:13 AM   Modules accepted: Orders

## 2019-08-01 NOTE — L&D Delivery Note (Signed)
DELIVERY NOTE  Pt complete and at +2 station with urge to push. Epidural controlling pain. Pt pushed and delivered a viable female infant in LOA position. Loose nuchal cord x1, easily reduced. Anterior and posterior shoulders spontaneously delivered with next two pushes; body easily followed next. Infant placed on mothers abdomen and bulb suction of mouth and nose performed. Cord was then clamped and cut by FOB. Cord blood obtained, 3VC. Baby had a vigorous spontaneous cry noted. Placenta then delivered at 1230 intact. Fundal massage performed and pitocin per protocol. Fundus firm. The following lacerations were noted: 2nd degree. Repaired in routine fashion with 2-0 vucryl on CT 1. RVS intact pre and post repair. Mother and baby stable. Counts correct   Infant time: 1227 Gender: female Placenta time: 1230 Apgars: 8/9 Weight: pending skin-to-skin

## 2019-08-18 LAB — OB RESULTS CONSOLE GBS: GBS: NEGATIVE

## 2019-09-06 ENCOUNTER — Inpatient Hospital Stay (HOSPITAL_COMMUNITY)
Admission: AD | Admit: 2019-09-06 | Discharge: 2019-09-07 | Disposition: A | Discharge status: Home / Self Care | Payer: 59 | Attending: Obstetrics and Gynecology | Admitting: Obstetrics and Gynecology

## 2019-09-06 ENCOUNTER — Encounter (HOSPITAL_COMMUNITY): Payer: Self-pay

## 2019-09-06 ENCOUNTER — Other Ambulatory Visit: Payer: Self-pay

## 2019-09-06 DIAGNOSIS — O479 False labor, unspecified: Secondary | ICD-10-CM

## 2019-09-06 DIAGNOSIS — Z3A00 Weeks of gestation of pregnancy not specified: Secondary | ICD-10-CM | POA: Insufficient documentation

## 2019-09-06 NOTE — MAU Note (Signed)
Pt reports to MAU for contractions that have been happening all day that became worse at 1pm. She states that they have been consistently 5-10 min apart and have gotten stronger. Denies VB and LOF, confirms good movement.

## 2019-09-07 DIAGNOSIS — O479 False labor, unspecified: Secondary | ICD-10-CM | POA: Diagnosis not present

## 2019-09-07 DIAGNOSIS — Z3A00 Weeks of gestation of pregnancy not specified: Secondary | ICD-10-CM | POA: Diagnosis not present

## 2019-09-07 NOTE — Discharge Instructions (Signed)

## 2019-09-08 ENCOUNTER — Telehealth (HOSPITAL_COMMUNITY): Payer: Self-pay | Admitting: *Deleted

## 2019-09-08 ENCOUNTER — Encounter (HOSPITAL_COMMUNITY): Payer: Self-pay | Admitting: *Deleted

## 2019-09-08 NOTE — Telephone Encounter (Signed)
Preadmission screen  

## 2019-09-11 ENCOUNTER — Encounter (HOSPITAL_COMMUNITY): Payer: Self-pay | Admitting: *Deleted

## 2019-09-11 ENCOUNTER — Telehealth (HOSPITAL_COMMUNITY): Payer: Self-pay | Admitting: *Deleted

## 2019-09-11 NOTE — Telephone Encounter (Signed)
Preadmission screen  

## 2019-09-15 ENCOUNTER — Inpatient Hospital Stay (HOSPITAL_COMMUNITY): Payer: 59 | Admitting: Anesthesiology

## 2019-09-15 ENCOUNTER — Inpatient Hospital Stay (HOSPITAL_COMMUNITY)
Admission: AD | Admit: 2019-09-15 | Discharge: 2019-09-17 | DRG: 806 | Disposition: A | Discharge status: Home / Self Care | Payer: 59 | Attending: Obstetrics and Gynecology | Admitting: Obstetrics and Gynecology

## 2019-09-15 ENCOUNTER — Encounter (HOSPITAL_COMMUNITY): Payer: Self-pay | Admitting: Obstetrics and Gynecology

## 2019-09-15 ENCOUNTER — Inpatient Hospital Stay (HOSPITAL_COMMUNITY): Admission: RE | Admit: 2019-09-15 | Payer: 59 | Source: Ambulatory Visit

## 2019-09-15 ENCOUNTER — Other Ambulatory Visit: Payer: Self-pay

## 2019-09-15 DIAGNOSIS — D696 Thrombocytopenia, unspecified: Secondary | ICD-10-CM | POA: Diagnosis present

## 2019-09-15 DIAGNOSIS — O9912 Other diseases of the blood and blood-forming organs and certain disorders involving the immune mechanism complicating childbirth: Secondary | ICD-10-CM | POA: Diagnosis present

## 2019-09-15 DIAGNOSIS — O99344 Other mental disorders complicating childbirth: Secondary | ICD-10-CM | POA: Diagnosis present

## 2019-09-15 DIAGNOSIS — F419 Anxiety disorder, unspecified: Secondary | ICD-10-CM | POA: Diagnosis present

## 2019-09-15 DIAGNOSIS — O9902 Anemia complicating childbirth: Secondary | ICD-10-CM | POA: Diagnosis present

## 2019-09-15 DIAGNOSIS — Z20822 Contact with and (suspected) exposure to covid-19: Secondary | ICD-10-CM | POA: Diagnosis present

## 2019-09-15 DIAGNOSIS — Z3A40 40 weeks gestation of pregnancy: Secondary | ICD-10-CM | POA: Diagnosis not present

## 2019-09-15 DIAGNOSIS — D649 Anemia, unspecified: Secondary | ICD-10-CM | POA: Diagnosis present

## 2019-09-15 DIAGNOSIS — O26893 Other specified pregnancy related conditions, third trimester: Secondary | ICD-10-CM | POA: Diagnosis present

## 2019-09-15 LAB — RESPIRATORY PANEL BY RT PCR (FLU A&B, COVID)
Influenza A by PCR: NEGATIVE
Influenza B by PCR: NEGATIVE
SARS Coronavirus 2 by RT PCR: NEGATIVE

## 2019-09-15 LAB — CBC
HCT: 38.3 % (ref 36.0–46.0)
Hemoglobin: 11.7 g/dL — ABNORMAL LOW (ref 12.0–15.0)
MCH: 23.9 pg — ABNORMAL LOW (ref 26.0–34.0)
MCHC: 30.5 g/dL (ref 30.0–36.0)
MCV: 78.3 fL — ABNORMAL LOW (ref 80.0–100.0)
Platelets: 150 10*3/uL (ref 150–400)
RBC: 4.89 MIL/uL (ref 3.87–5.11)
RDW: 15.3 % (ref 11.5–15.5)
WBC: 12.6 10*3/uL — ABNORMAL HIGH (ref 4.0–10.5)
nRBC: 0 % (ref 0.0–0.2)

## 2019-09-15 LAB — ABO/RH: ABO/RH(D): O POS

## 2019-09-15 LAB — TYPE AND SCREEN
ABO/RH(D): O POS
Antibody Screen: NEGATIVE

## 2019-09-15 MED ORDER — LACTATED RINGERS IV SOLN: 500.0000 mL | Freq: Once | INTRAVENOUS | Status: DC

## 2019-09-15 MED ORDER — ACETAMINOPHEN 325 MG PO TABS: 650.0000 mg | ORAL_TABLET | ORAL | Status: DC | PRN

## 2019-09-15 MED ORDER — PANTOPRAZOLE SODIUM 40 MG PO TBEC
40.0000 mg | DELAYED_RELEASE_TABLET | Freq: Every day | ORAL | Status: DC
  Filled 2019-09-15 (×2): qty 1

## 2019-09-15 MED ORDER — SIMETHICONE 80 MG PO CHEW: 80.0000 mg | CHEWABLE_TABLET | ORAL | Status: DC | PRN

## 2019-09-15 MED ORDER — LIDOCAINE HCL (PF) 1 % IJ SOLN: 30.0000 mL | INTRAMUSCULAR | Status: DC | PRN

## 2019-09-15 MED ORDER — LACTATED RINGERS IV SOLN: 500.0000 mL | INTRAVENOUS | Status: DC | PRN

## 2019-09-15 MED ORDER — PHENYLEPHRINE 40 MCG/ML (10ML) SYRINGE FOR IV PUSH (FOR BLOOD PRESSURE SUPPORT)
80.0000 ug | PREFILLED_SYRINGE | INTRAVENOUS | Status: AC | PRN
  Administered 2019-09-15 (×3): 80 ug via INTRAVENOUS
  Filled 2019-09-15 (×3): qty 10

## 2019-09-15 MED ORDER — SOD CITRATE-CITRIC ACID 500-334 MG/5ML PO SOLN: 30.0000 mL | ORAL | Status: DC | PRN

## 2019-09-15 MED ORDER — LACTATED RINGERS IV SOLN: INTRAVENOUS | Status: DC

## 2019-09-15 MED ORDER — PHENYLEPHRINE 40 MCG/ML (10ML) SYRINGE FOR IV PUSH (FOR BLOOD PRESSURE SUPPORT)
80.0000 ug | PREFILLED_SYRINGE | INTRAVENOUS | Status: DC | PRN
  Administered 2019-09-15: 11:00:00 80 ug via INTRAVENOUS
  Filled 2019-09-15: qty 10

## 2019-09-15 MED ORDER — OXYCODONE-ACETAMINOPHEN 5-325 MG PO TABS: 1.0000 | ORAL_TABLET | ORAL | Status: DC | PRN

## 2019-09-15 MED ORDER — EPHEDRINE 5 MG/ML INJ
10.0000 mg | INTRAVENOUS | Status: DC | PRN
  Filled 2019-09-15: qty 2

## 2019-09-15 MED ORDER — DIPHENHYDRAMINE HCL 50 MG/ML IJ SOLN: 12.5000 mg | INTRAMUSCULAR | Status: DC | PRN

## 2019-09-15 MED ORDER — OXYTOCIN BOLUS FROM INFUSION
500.0000 mL | Freq: Once | INTRAVENOUS | Status: AC
  Administered 2019-09-15: 500 mL via INTRAVENOUS

## 2019-09-15 MED ORDER — ONDANSETRON HCL 4 MG/2ML IJ SOLN: 4.0000 mg | INTRAMUSCULAR | Status: DC | PRN

## 2019-09-15 MED ORDER — WITCH HAZEL-GLYCERIN EX PADS
1.0000 "application " | MEDICATED_PAD | CUTANEOUS | Status: DC | PRN
  Administered 2019-09-15: 1 via TOPICAL

## 2019-09-15 MED ORDER — ZOLPIDEM TARTRATE 5 MG PO TABS: 5.0000 mg | ORAL_TABLET | Freq: Every evening | ORAL | Status: DC | PRN

## 2019-09-15 MED ORDER — LIDOCAINE HCL (PF) 1 % IJ SOLN
INTRAMUSCULAR | Status: DC | PRN
  Administered 2019-09-15: 3 mL via EPIDURAL
  Administered 2019-09-15: 4 mL via EPIDURAL

## 2019-09-15 MED ORDER — FLEET ENEMA 7-19 GM/118ML RE ENEM: 1.0000 | ENEMA | RECTAL | Status: DC | PRN

## 2019-09-15 MED ORDER — OXYCODONE-ACETAMINOPHEN 5-325 MG PO TABS: 2.0000 | ORAL_TABLET | ORAL | Status: DC | PRN

## 2019-09-15 MED ORDER — FENTANYL-BUPIVACAINE-NACL 0.5-0.125-0.9 MG/250ML-% EP SOLN
12.0000 mL/h | EPIDURAL | Status: DC | PRN
  Filled 2019-09-15: qty 250

## 2019-09-15 MED ORDER — ACETAMINOPHEN 325 MG PO TABS
650.0000 mg | ORAL_TABLET | ORAL | Status: DC | PRN
  Administered 2019-09-15: 650 mg via ORAL
  Filled 2019-09-15: qty 2

## 2019-09-15 MED ORDER — IBUPROFEN 600 MG PO TABS
600.0000 mg | ORAL_TABLET | Freq: Four times a day (QID) | ORAL | Status: DC
  Administered 2019-09-15 – 2019-09-17 (×8): 600 mg via ORAL
  Filled 2019-09-15 (×8): qty 1

## 2019-09-15 MED ORDER — BENZOCAINE-MENTHOL 20-0.5 % EX AERO
1.0000 "application " | INHALATION_SPRAY | CUTANEOUS | Status: DC | PRN
  Administered 2019-09-15 – 2019-09-17 (×2): 1 via TOPICAL
  Filled 2019-09-15 (×2): qty 56

## 2019-09-15 MED ORDER — ONDANSETRON HCL 4 MG PO TABS: 4.0000 mg | ORAL_TABLET | ORAL | Status: DC | PRN

## 2019-09-15 MED ORDER — SODIUM CHLORIDE (PF) 0.9 % IJ SOLN
INTRAMUSCULAR | Status: DC | PRN
  Administered 2019-09-15: 12 mL/h via EPIDURAL

## 2019-09-15 MED ORDER — EPHEDRINE 5 MG/ML INJ
10.0000 mg | INTRAVENOUS | Status: DC | PRN
  Administered 2019-09-15: 10 mg via INTRAVENOUS
  Filled 2019-09-15: qty 2
  Filled 2019-09-15: qty 10

## 2019-09-15 MED ORDER — DIPHENHYDRAMINE HCL 25 MG PO CAPS: 25.0000 mg | ORAL_CAPSULE | Freq: Four times a day (QID) | ORAL | Status: DC | PRN

## 2019-09-15 MED ORDER — FENTANYL CITRATE (PF) 100 MCG/2ML IJ SOLN
INTRAMUSCULAR | Status: AC
  Filled 2019-09-15: qty 2

## 2019-09-15 MED ORDER — OXYTOCIN 40 UNITS IN NORMAL SALINE INFUSION - SIMPLE MED
2.5000 [IU]/h | INTRAVENOUS | Status: DC
  Administered 2019-09-15: 2.5 [IU]/h via INTRAVENOUS
  Filled 2019-09-15: qty 1000

## 2019-09-15 MED ORDER — TETANUS-DIPHTH-ACELL PERTUSSIS 5-2.5-18.5 LF-MCG/0.5 IM SUSP: 0.5000 mL | Freq: Once | INTRAMUSCULAR | Status: DC

## 2019-09-15 MED ORDER — DIBUCAINE (PERIANAL) 1 % EX OINT
1.0000 "application " | TOPICAL_OINTMENT | CUTANEOUS | Status: DC | PRN
  Administered 2019-09-15: 1 via RECTAL
  Filled 2019-09-15: qty 28

## 2019-09-15 MED ORDER — SENNOSIDES-DOCUSATE SODIUM 8.6-50 MG PO TABS
2.0000 | ORAL_TABLET | ORAL | Status: DC
  Administered 2019-09-15 – 2019-09-16 (×2): 2 via ORAL
  Filled 2019-09-15 (×2): qty 2

## 2019-09-15 MED ORDER — PRENATAL MULTIVITAMIN CH
1.0000 | ORAL_TABLET | Freq: Every day | ORAL | Status: DC
  Administered 2019-09-16 – 2019-09-17 (×2): 1 via ORAL
  Filled 2019-09-15 (×2): qty 1

## 2019-09-15 MED ORDER — FENTANYL CITRATE (PF) 100 MCG/2ML IJ SOLN
100.0000 ug | Freq: Once | INTRAMUSCULAR | Status: AC
  Administered 2019-09-15: 100 ug via INTRAVENOUS

## 2019-09-15 MED ORDER — ONDANSETRON HCL 4 MG/2ML IJ SOLN: 4.0000 mg | Freq: Four times a day (QID) | INTRAMUSCULAR | Status: DC | PRN

## 2019-09-15 MED ORDER — COCONUT OIL OIL
1.0000 "application " | TOPICAL_OIL | Status: DC | PRN
  Administered 2019-09-17: 1 via TOPICAL

## 2019-09-15 NOTE — MAU Note (Signed)
Jodi Adams is a 34 y.o. at [redacted]w[redacted]d here in MAU reporting: worsening contractions for the past 2.5 hours. They are about every 3-4 minutes. No bleeding, no LOF. +FM. Was 3/80 with previous VE.  Onset of complaint: today  Pain score: 8/10  Vitals:   09/15/19 0900  BP: (!) 120/49  Pulse: 91  Resp: 18  Temp: 99 F (37.2 C)  SpO2: 100%     FHT: +FM  Lab orders placed from triage: none

## 2019-09-15 NOTE — H&P (Signed)
Ola Raap is a 34 y.o. female presenting for contractions beginning 3-4hrs ago and becoming stronger. +FM, denies VB, LOF. Was 3cm on evaluation in MAU on 2/6  [facility] c/b mild TCP with platelets 147 @ 31wks, anxiety on counseling. 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 Had vacuum extraction with daughter with significant perineal tear. Patient states daughter was "stuck under pelvis" but isn't sure regarding true dystocia call OB History    Gravida  2   Para  1   Term  1   Preterm  0   AB  0   Living  1     SAB  0   TAB  0   Ectopic  0   Multiple  0   Live Births             Past Medical History:  Diagnosis Date  . Anxiety   . Chronic nausea   . Depression   . IBS (irritable bowel syndrome)    ulcers  . Meningitis 2005   encephalitis  . Pelvic pain    Past Surgical History:  Procedure Laterality Date  . DIAGNOSTIC LAPAROSCOPY  2012   negative endo and hysteroscopy   Family History: family history includes Bipolar disorder in her mother; Breast cancer in her maternal grandmother and paternal grandmother; Depression in her maternal grandmother; Diabetes in her maternal grandfather; Heart disease in her father and maternal grandfather; Hypertension in her brother, father, and maternal grandfather; Thyroid disease in her father. Social History:  reports that she has never smoked. She has never used smokeless tobacco. She reports previous alcohol use. She reports that she does not use drugs.     Maternal Diabetes: No Genetic Screening: Declined Maternal Ultrasounds/Referrals: Normal Fetal Ultrasounds or other Referrals:  None Maternal Substance Abuse:  No Significant Maternal Medications:  None Significant Maternal Lab Results:  Group B Strep negative Other Comments:  None  Review of Systems Maternal Medical History:  Reason for admission: Contractions.   Contractions: Onset was 3-5 hours ago.   Frequency: regular.    Fetal activity: Perceived fetal activity is  normal.    Prenatal complications: No bleeding, PIH or pre-eclampsia.   Prenatal Complications - Diabetes: none.    Dilation: 7 Effacement (%): 100 Station: 0 Exam by:: Daneil Dan, RNC Blood pressure (!) 120/49, pulse 91, temperature 99 F (37.2 C), temperature source Oral, resp. rate 18, SpO2 100 %.   Category 1 tracing, TOCO ctxs q 2-35min  Exam Physical Exam  Constitutional: She is oriented to person, place, and time. She appears well-developed and well-nourished. No distress.  HENT:  Head: Normocephalic and atraumatic.  Eyes: Pupils are equal, round, and reactive to light.  Cardiovascular: Normal rate and regular rhythm. Exam reveals no gallop.  No murmur heard. GI: There is abdominal tenderness (w/ contractions). There is no rebound and no guarding.  Genitourinary:    Vagina and uterus normal.   Musculoskeletal:        General: Normal range of motion.     Cervical back: Normal range of motion and neck supple.  Neurological: She is alert and oriented to person, place, and time.  Skin: Skin is warm and dry.    Prenatal labs: ABO, Rh: O/Positive/-- (07/28 0000) Antibody: Negative (07/28 0000) Rubella: Immune (07/28 0000) RPR: Nonreactive (07/28 0000)  HBsAg: Negative (07/28 0000)  HIV: Non-reactive (07/28 0000)  GBS: Negative/-- (01/18 0000)   Assessment/Plan: This is a 34yo G2P1001 @ 40 0/7 by LMP c/w 8wk TVUS admitted in labor.  [facility]NC c/b mild thrombocytopenia, anxiety, h/o PPH and operative vaginal delivery with questionable shoulder dystocia  Admit to L&D, CLD and CEFM. Pt desires epidural. Anticipate SVD.  H/o VAVD with 6lb14oz infant. Shoulder dystocia RBA and operative delivery RBA reviewed. Dystocia may involve additional personnel, NICU team, possible fracture of clavicle or humerus and may ultimately require c-section if standard maneuvers are unable to deliver infant. RBA of operative delivery but are not limited to the risks of anesthesia, bleeding,  infection, damage to maternal tissues, fetal cephalhematoma.  There is also the risk of inability to effect vaginal delivery of the head, or shoulder dystocia that cannot be resolved by established maneuvers, leading to the need for emergency cesarean section.   Patient understands all of these. Having baby boy, paid for circ in hospital      Carlisle Cater 09/15/2019, 9:36 AM

## 2019-09-15 NOTE — Anesthesia Preprocedure Evaluation (Signed)
Anesthesia Evaluation  Patient identified by MRN, date of birth, ID band Patient awake    Reviewed: Allergy & Precautions, Patient's Chart, lab work & pertinent test results  Airway Mallampati: II  TM Distance: >3 FB Neck ROM: Full    Dental no notable dental hx. (+) Teeth Intact   Pulmonary neg pulmonary ROS,    Pulmonary exam normal breath sounds clear to auscultation       Cardiovascular negative cardio ROS Normal cardiovascular exam Rhythm:Regular Rate:Normal     Neuro/Psych PSYCHIATRIC DISORDERS Anxiety Depression negative neurological ROS     GI/Hepatic Neg liver ROS, GERD  Medicated and Controlled,  Endo/Other  negative endocrine ROS  Renal/GU negative Renal ROS  negative genitourinary   Musculoskeletal negative musculoskeletal ROS (+)   Abdominal   Peds  Hematology  (+) anemia ,   Anesthesia Other Findings   Reproductive/Obstetrics (+) Pregnancy                             Anesthesia Physical Anesthesia Plan  ASA: II  Anesthesia Plan: Epidural   Post-op Pain Management:    Induction:   PONV Risk Score and Plan:   Airway Management Planned: Natural Airway  Additional Equipment:   Intra-op Plan:   Post-operative Plan:   Informed Consent: I have reviewed the patients History and Physical, chart, labs and discussed the procedure including the risks, benefits and alternatives for the proposed anesthesia with the patient or authorized representative who has indicated his/her understanding and acceptance.       Plan Discussed with: Anesthesiologist  Anesthesia Plan Comments:         Anesthesia Quick Evaluation

## 2019-09-15 NOTE — Lactation Note (Signed)
This note was copied from a baby's chart. Lactation Consultation Note  Patient Name: Jodi Adams [id number]'O Date: 09/15/2019 Reason for consult: Initial assessment;Term;1st time breastfeeding  P2 mother whose infant is now 57 hours old.  This is a term baby.  Mother was not able to breast feed her first child (now 32 years old).  She informed me that she worked with many nurses and went to specialists but she could not produce milk for her baby.  "No one could figure out why."  Mother has no medical condition that may hinder milk production that I am aware of.  Baby was in the bassinet when I arrived; MD was performing his assessment.  Baby has latched and fed three times since delivery.  Mother was so excited to reveal this information about how he was "so different" from her daughter and latched easily.  Encouragement provided and I reassured her that we will be available to help as needed.  Mother's breasts are soft and non tender and nipples are everted and intact.  No sign of trauma noted.  Encouraged feeding 8-12 times/24 hours or sooner if baby shows feeding cues.  Reviewed cues.  Taught mother hand expression and one drop of colostrum collected.  Suggested she continue practicing before/after every feeding to help stimulate breasts.  Container provided and milk storage times discussed.  Finger feeding demonstrated.  Mom made aware of O/P services, breastfeeding support groups, community resources, and our phone # for post-discharge questions. Mother has a DEBP from her insurance company.  She will call for any further questions/concerns.   Maternal Data Formula Feeding for Exclusion: No Has patient been taught Hand Expression?: Yes Does the patient have breastfeeding experience prior to this delivery?: No  Feeding Feeding Type: Breast Fed  LATCH Score                   Interventions    Lactation Tools Discussed/Used WIC Program: No   Consult Status Consult Status:  Follow-up Date: 09/16/19 Follow-up type: In-patient    Dora Sims 09/15/2019, 8:19 PM

## 2019-09-15 NOTE — Anesthesia Procedure Notes (Signed)

## 2019-09-16 LAB — CBC
HCT: 34.8 % — ABNORMAL LOW (ref 36.0–46.0)
Hemoglobin: 10.6 g/dL — ABNORMAL LOW (ref 12.0–15.0)
MCH: 24.4 pg — ABNORMAL LOW (ref 26.0–34.0)
MCHC: 30.5 g/dL (ref 30.0–36.0)
MCV: 80 fL (ref 80.0–100.0)
Platelets: 144 10*3/uL — ABNORMAL LOW (ref 150–400)
RBC: 4.35 MIL/uL (ref 3.87–5.11)
RDW: 15.2 % (ref 11.5–15.5)
WBC: 17.3 10*3/uL — ABNORMAL HIGH (ref 4.0–10.5)
nRBC: 0 % (ref 0.0–0.2)

## 2019-09-16 LAB — RPR: RPR Ser Ql: NONREACTIVE

## 2019-09-16 NOTE — Lactation Note (Signed)
This note was copied from a baby's chart. Lactation Consultation Note  Patient Name: Jodi Adams [id number] Date: 09/16/2019 Reason for consult: Follow-up assessment Mom has a history of inability to produce milk.  She has a history of a traumatic birth followed by a post partum hemorrhage.  Newborn is 71 hours old.  He initially fed well but was very sleepy during the night.  RN in the room and she just assisted mom with a good feeding on the right side.  Baby was also given 10 mls of donor milk by 5 french feeding tube.  Baby is positioned in cross cradle on the left breast.  He was showing feeding cues.  Baby opened wide and latched after a few attempts.  Baby sucked 4-5 times and then became very sleepy.  Unable to stimulate baby to continue feeding.  Discussed initiating pumping and hand expressing every 3 hours due to history of insufficient supply.  Mom agreeable.  Colostrum expressed from both breasts.  Symphony pump initiated with instructions on use, cleaning and EBM storage.  Instructed to finger or spoon feed any colostrum obtained.  Mom does report significant breast changes with this pregnancy.  Encouraged to call for assist when baby is ready to feed again.  Maternal Data    Feeding Feeding Type: Breast Fed  LATCH Score Latch: Repeated attempts needed to sustain latch, nipple held in mouth throughout feeding, stimulation needed to elicit sucking reflex.  Audible Swallowing: None  Type of Nipple: Everted at rest and after stimulation  Comfort (Breast/Nipple): Soft / non-tender  Hold (Positioning): Assistance needed to correctly position infant at breast and maintain latch.  LATCH Score: 6  Interventions    Lactation Tools Discussed/Used Tools: 41F feeding tube / Syringe Pump Review: Setup, frequency, and cleaning;Milk Storage Initiated by:: [email or web address] Date initiated:: 09/16/19   Consult Status Consult Status: Follow-up Date: 09/17/19 Follow-up type:  In-patient    Huston Foley 09/16/2019, 2:14 PM

## 2019-09-16 NOTE — Progress Notes (Signed)
Post Partum Day 1 Subjective: no complaints, up ad lib, voiding, tolerating PO and nl lochia, pain controlled.  bottom and back pain - reassured  Objective: Blood pressure (!) 88/58, pulse 70, temperature 98.2 F (36.8 C), temperature source Oral, resp. rate 16, SpO2 100 %, unknown if currently breastfeeding.  Physical Exam:  General: alert and no distress Lochia: appropriate Uterine Fundus: firm   Recent Labs    09/15/19 0940 09/16/19 0633  HGB 11.7* 10.6*  HCT 38.3 34.8*    Assessment/Plan: Plan for discharge tomorrow, Breastfeeding and Lactation consult.  Circumcision later in day - trying to help breastfeeding be better established.  Wants to stay today, if changes mind will let us know.     LOS: 1 day   Rusti Arizmendi Bovard-Stuckert 09/16/2019, 9:13 AM

## 2019-09-16 NOTE — Progress Notes (Signed)
CSW received consult due to score 11  on [place] Depression Screen as well as Anxiety depression, and PPD. CSW went to speak with MOB at bedside.   CSW congratulated MOB and FOB on the birth of infant. CSW advised MOB of CSW's role and the reason for CSW coming to visit with her. MOB reported that she has a history of depression and anxiety, however more so anxiety. MOB reported that she has traumatic birth experience in 2017 which increased her anxiety. 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 MOB reported that her symptoms lasted for about an year. MOB expressed that she once was on medications, however no longer on medications. MOB does report that she is seeing a therapist with her last visit about a month ago. MOB disclosed that she does have the ability to follow up with therapist as needed. MOB advised CSW that the score 11 was around her anxiety and expressed that she is not SI or HI.  MOB reported that since she has given birth, she has been feeling fine and excited. MOB reported that she has all needed items to care for infant with no other needs at this time.   CSW provided education regarding Baby Blues vs PMADs and provided MOB with resources for mental health follow up.  CSW encouraged MOB to evaluate her mental health throughout the postpartum period with the use of the New Mom Checklist developed by Postpartum Progress as well as the [place] Postnatal Depression Scale and notify a medical professional if symptoms arise.  CSW provided MOB with  SIDS education in which MOB reported that she understood and has a safe place for infant to sleep once arrived home.   Claude Manges Latifa Noble, MSW, LCSW [facility] [phone]

## 2019-09-16 NOTE — Anesthesia Postprocedure Evaluation (Signed)
Anesthesia Post Note  Patient: Jodi Adams  Procedure(s) Performed: AN AD HOC LABOR EPIDURAL     Patient location during evaluation: Mother Baby Anesthesia Type: Epidural Level of consciousness: awake and alert Pain management: pain level controlled Vital Signs Assessment: post-procedure vital signs reviewed and stable Respiratory status: spontaneous breathing, nonlabored ventilation and respiratory function stable Cardiovascular status: stable Postop Assessment: no headache, no backache, epidural receding, no apparent nausea or vomiting, patient able to bend at knees, adequate PO intake and able to ambulate Anesthetic complications: no    Last Vitals:  Vitals:   09/15/19 2335 09/16/19 0514  BP: (!) 84/54 (!) 88/58  Pulse: 68 70  Resp: 16 16  Temp: 36.7 C 36.8 C  SpO2: 98% 100%    Last Pain:  Vitals:   09/16/19 0514  TempSrc: Oral  PainSc: 6    Pain Goal:                   Laban Emperor

## 2019-09-17 ENCOUNTER — Inpatient Hospital Stay (HOSPITAL_COMMUNITY): Admission: AD | Admit: 2019-09-17 | Payer: 59 | Source: Home / Self Care | Admitting: Obstetrics and Gynecology

## 2019-09-17 ENCOUNTER — Inpatient Hospital Stay (HOSPITAL_COMMUNITY): Payer: 59

## 2019-09-17 MED ORDER — ACETAMINOPHEN 325 MG PO TABS: 650.0000 mg | ORAL_TABLET | ORAL | 1 refills | Status: DC | PRN

## 2019-09-17 MED ORDER — IBUPROFEN 600 MG PO TABS: 600.0000 mg | ORAL_TABLET | Freq: Four times a day (QID) | ORAL | 0 refills | Status: DC

## 2019-09-17 NOTE — Progress Notes (Signed)
Post Partum Day 2 Subjective: up ad lib and tolerating PO  Some back pain but controlled  Objective: Blood pressure 99/61, pulse 71, temperature 98.4 F (36.9 C), temperature source Axillary, resp. rate 18, SpO2 100 %, unknown if currently breastfeeding.  Physical Exam:  General: alert and cooperative Lochia: appropriate Uterine Fundus: firm   Recent Labs    09/15/19 0940 09/16/19 0633  HGB 11.7* 10.6*  HCT 38.3 34.8*    Assessment/Plan: Discharge home  F/u office 6 weeks   LOS: 2 days   Jodi Adams 09/17/2019, 9:26 AM

## 2019-09-17 NOTE — Discharge Summary (Signed)
OB Discharge Summary     Patient Name: Jodi Adams DOB: 1986-02-10 MRN: [id number]  Date of admission: 09/15/2019 Delivering MD: Eula Flax M   Date of discharge: 09/17/2019  Admitting diagnosis: [redacted] weeks gestation of pregnancy [Z3A.40] Intrauterine pregnancy: [redacted]w[redacted]d     Secondary diagnosis:  Active Problems:   [redacted] weeks gestation of pregnancy  Additional problems: none     Discharge diagnosis: Term Pregnancy Delivered                                                                                                Post partum procedures:none  Augmentation: AROM  Complications: None  Hospital course:  Onset of Labor With Vaginal Delivery     34 y.o. yo [id number] at [redacted]w[redacted]d was admitted in Active Labor on 09/15/2019. Patient had an uncomplicated labor course as follows:  Membrane Rupture Time/Date: 11:35 AM ,09/15/2019   Intrapartum Procedures: Episiotomy: None [1]                                         Lacerations:  1st degree [2];Perineal [11]  Patient had a delivery of a Viable infant. 09/15/2019  Information for the patient's newborn:  Rekita, Miotke [[id number]]  Delivery Method: Vaginal, Spontaneous(Filed from Delivery Summary)     Pateint had an uncomplicated postpartum course.  She is ambulating, tolerating a regular diet, passing flatus, and urinating well. Patient is discharged home in stable condition on 09/17/19.   Physical exam  Vitals:   09/15/19 2335 09/16/19 0514 09/16/19 2100 09/17/19 0517  BP: (!) 84/54 (!) 88/58 102/68 99/61  Pulse: 68 70 72 71  Resp: 16 16 18 18   Temp: 98 F (36.7 C) 98.2 F (36.8 C) 98.7 F (37.1 C) 98.4 F (36.9 C)  TempSrc: Oral Oral Oral Axillary  SpO2: 98% 100% 100% 100%   General: alert and cooperative Lochia: appropriate Uterine Fundus: firm  Labs: Lab Results  Component Value Date   WBC 17.3 (H) 09/16/2019   HGB 10.6 (L) 09/16/2019   HCT 34.8 (L) 09/16/2019   MCV 80.0 09/16/2019   PLT 144 (L) 09/16/2019   No  flowsheet data found.  Discharge instruction: per After Visit Summary and "Baby and Me Booklet".  After visit meds:  Allergies as of 09/17/2019      Reactions   Lactose Intolerance (gi)    Ceclor [cefaclor] Rash      Medication List    STOP taking these medications   amoxicillin 875 MG tablet Commonly known as: AMOXIL   metoCLOPramide 10 MG/10ML Soln Commonly known as: REGLAN     TAKE these medications   acetaminophen 325 MG tablet Commonly known as: Tylenol Take 2 tablets (650 mg total) by mouth every 4 (four) hours as needed (for pain scale < 4).   ibuprofen 600 MG tablet Commonly known as: ADVIL Take 1 tablet (600 mg total) by mouth every 6 (six) hours.   multivitamin-prenatal 27-0.8 MG Tabs tablet Take 1 tablet by mouth daily at 12  noon.   pantoprazole 40 MG tablet Commonly known as: PROTONIX Take 40 mg by mouth daily.       Diet: routine diet  Activity: Advance as tolerated. Pelvic rest for 6 weeks.   Outpatient follow up:6 weeks Follow up Appt:No future appointments. Follow up Visit:No follow-ups on file.  Postpartum contraception: Vasectomy and Condoms  Newborn Data: Live born female  Birth Weight: 7 lb 10.4 oz (3470 g) APGAR: 8, 9  Newborn Delivery   Birth date/time: 09/15/2019 12:27:00 Delivery type: Vaginal, Spontaneous      Baby Feeding: Breast Disposition:home with mother   09/17/2019 Oliver Pila, MD

## 2019-09-17 NOTE — Lactation Note (Addendum)
This note was copied from a baby's chart. Lactation Consultation Note: P2, Infant is 46 hours and is 7% wt loss this am.  LC arrived in mothers room and mother getting ready to breastfeed infant .  Mother assist with placing good support pillows and infant placed in cross cradle hold.  Mother latched infant and assist with flanging infants lips for wider gape. Advised mother in breast compression. Infant sustained latch for 15 mins. Observed steady suckling and swallows.   Assist mother with hand expression .OBserved drops of colostrum on the nipple.  Infant placed on the alternat breast . Infant sutained latch again for 10-15 mins.  Assist mother with using a #5 fr feeding tube . Infant took 10 ml with good sustained latch . Advised mother to increase infants amt according to supplemental guidelines. Parents have supplemental guideline handout.  Lots of discussion about mothers history of low milk production with last child.  Recommendations were for mother to begin to hand express and do good massage before every pumping . Pump for at least 20 mins.   Advised mother to post pump 4-6 times daily until milk comes to volume.  Mother advised to continue to cue base feed infant and allow for cluster feeding.  Suggested that mother try power pumping, and a supplement of her choice. Support and encouragement given to mother . Advised mother to follow up with LC OP DEPT for a pre and post wt assessment. Mother name was sent to [facility] office for a follow up phone call to schedule an appt.   Mother receptive to all teaching.    Patient Name: Jodi Adams BVAPO'L Date: 09/17/2019 Reason for consult: Follow-up assessment   Maternal Data    Feeding Feeding Type: Breast Fed  LATCH Score Latch: Grasps breast easily, tongue down, lips flanged, rhythmical sucking.  Audible Swallowing: Spontaneous and intermittent  Type of Nipple: Everted at rest and after stimulation  Comfort (Breast/Nipple):  Filling, red/small blisters or bruises, mild/mod discomfort  Hold (Positioning): Assistance needed to correctly position infant at breast and maintain latch.  LATCH Score: 8  Interventions Interventions: Assisted with latch;Skin to skin;Hand express;Breast compression;Adjust position;Support pillows;Position options;Expressed milk;Comfort gels;Hand pump  Lactation Tools Discussed/Used Tools: 71F feeding tube / Syringe   Consult Status Consult Status: Complete    Michel Bickers 09/17/2019, 11:47 AM

## 2019-09-19 NOTE — Telephone Encounter (Signed)
1320 - 1348 - I returned Ms. Frietas's call. She has a 5 day old newborn, Earlene Plater. Today at [facility], his weight was down from 7 lbs 10 ounces to 6 lbs 13 ounces. Her chart history and lc notes from this past week indicate that Earlene Plater has been inconsistently latching. She was instructed to pump 8 times a day, and while here she supplemented with donor milk via feeding tube device.  She began to supplement post breast feed with formula upon discharge as recommended by her provider. She was supplementing via curved tip syringe. She tried yesterday (2/18) to go without supplementing and breast feed exclusively. She reports that her milk began to come in yesterday, and Earlene Plater cluster fed overnight. She reports that he began to have difficulty latching due to engorgement and became very fussy.  Her ped today recommended supplementing 30-60 mls post breast feeding. I recommended that if she supplements, she should switch to a bottle, and if he does not latch, she should offer 50-60 mls.  I recommended ice for the engorgement and some moist heat prior to pumping and latching.  We discussed gentle massage for clogged areas prior to pumping.  I recommended that she continue with pumping for 15-20 minutes 8 times a day. She is to continue to encourage Earlene Plater to latch, and post pump. She can also pre-pump for a few minutes to soften areolar tissue.  Ms. Ernie Avena is currently trying to set up an OP lactation visit. I put in an additional message as the provider had difficulty reaching her earlier today. I recommended that Ms. Ernie Avena also leave an alternate phone number. I encouraged her to call me back tomorrow, as I will be back on shift, for follow up PRN.

## 2019-09-22 ENCOUNTER — Ambulatory Visit: Payer: Self-pay

## 2019-09-22 NOTE — Lactation Note (Signed)
This note was copied from a baby's chart. Lactation Consultation Note  Patient Name: Jodi Adams Today's Date: 09/22/2019     09/22/2019  Name: Jodi Adams MRN: [id number] Date of Birth: 09/15/2017 Gestational Age: Gestational Age: [redacted]w[redacted]d Birth Weight: 122.4 oz Weight today:  Weight: 7 lb 4.5 oz (3302 g)  7 day old infant presents today with mom for feeding assessment. Mom reports she was not able to BF her 32 yo daughter, she reports her milk did not come in with her daughter. She tried CST, Art therapist, and many other things to try to get milk. She was able to breastfeed infant with the help of her friends for 6 months. Mom reports she hemorrhaged with her daughter. She has been engorged with this infant and infant had difficulty latching.    Infant has gained 62 grams in the last 5 days with an average daily weight gain of 12 grams a day. Infant lowest weight was 6 pounds 13 ounces. Infant weighed 7 pounds 0.5 ounces Saturday at the peds office.   Infant is self awakening at night to feed and mom is waking him during the day. She has been pumping and bottle feeding in the last 2 days due to pain at the breast per mom. Mom reports pain at the breast started once she got engorged. Mom reports the engorgement is much improved in the last 2 days.   Infant with natal tooth and mom is concerned infant may have a syndrome. Tooth is on the lower gum just above the gumline. Discussed that he has been examined by his Pediatricians and they will let mom know if there are any concerns. Infant to follow up with Ped tomorrow.   Infant with thick labial frenulum that inserts at the bottom of the gun ridge. Upper lip tight with flanging, mom flanges on the breast as needed. Infant with recessed chin and sucking blister to upper center lip. Infant with short posterior lingual frenulum. Infant with some snapback with initial suckling and some smacking on the breast. Infant needs a lot of  stimulation to maintain suckling at the breast. Reviewed with mom that it is appropriate to wait to see if infant needs to be evaluated once his energy levels increase with the added calories and weight gain. Mom given Website and local provider information.   Infant latched to the breast well. He did need lip flanging with feeding. Infant did not sustain latch well. Mom is pain with feeding despite positioning and lip flanging. We tried the # 24 NS and mom reported increasing comfort and infant did sustain latch better. Infant still sleepy but did awaken easily with stimulation. Infant finished feeding with a bottle and tolerated well.   Gave mom # 21 flanges as # 24 flanges seem too big. Reviewed trying the smaller flanges to see how they do. Mom pleased with milk volume yield in the office.   Reviewed how to use the NS and importance of weaning off as soon as possible.   Lips were curled in on the [facility] Tomo bottle, encouraged mom to try the Dr. Theora Gianotti bottle she has.   Reviewed hydration, eating enough calories, resting when she can.   Infant to follow up with Dr. Abran Cantor tomorrow. Infant to follow up with Lactation in 1 week. Mom informed of Virtual BF Support Groups.     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 Brown's and [place], some  choking in the beginning of feeding) Brand: Similac Formula volume: 45-60 ml Formula frequency: x 1   Breast milk volume: 45-60 ml Breast milk frequency: x 7-8   Pump type: Medela pump in style Pump frequency: every 2-3 hours Pump volume: 50 ml  Infant Output Assessment: Voids per 24 hours: 8+ Urine color: Clear yellow Stools per 24 hours: 8+ Stool color: Yellow  Breast Assessment: Breast: Full, Compressible Nipple: Erect Pain level: 8(with intial latch is 8 and then down a 5-6) Pain interventions: Bra, Expressed breast milk, Breast pump  Feeding Assessment: Infant oral assessment: Variance Infant oral assessment comment: see note Positioning: Cross cradle(right breast, 15 minutes off and on) Latch: 1 - Repeated attempts needed to sustain latch, nipple held in mouth throughout feeding, stimulation needed to elicit sucking reflex. Audible swallowing: 2 - Spontaneous and intermittent Type of nipple: 2 - Everted at rest and after stimulation Comfort: 0 - Engorged, cracked, bleeding, large blisters, severe discomfort Hold: 1 - Assistance needed to correctly position infant at breast and maintain latch LATCH score: 6 Latch assessment: Deep Lips flanged: No(upper and lower lip needs flanging on the breast) Suck assessment: Displays both Tools: Nipple shield 24 mm Pre-feed weight: 3302 grams Post feed weight: 3328 grams Amount transferred: 26 ml Amount supplemented: 0  Additional Feeding Assessment: Infant oral assessment: Variance Infant oral assessment comment: see note Positioning: Cross cradle(left breast) Latch: 1 - Repeated attempts neede to sustain latch, nipple held in mouth throughout feeding, stimulation needed to elicit sucking reflex. Audible swallowing: 2 - Spontaneous and intermittent Type of nipple: 2 - Everted at rest and after stimulation Comfort: 0 - Engorged, cracked, bleeding, large blisters, severe discomfort Hold: 1 - Assistance needed to correctly  position infant at breast and maintain latch LATCH score: 6 Latch assessment: Deep Lips flanged: No Suck assessment: Displays both Tools: Nipple shield 24 mm Pre-feed weight: 3328 grams Post feed weight: 3342 grams Amount transferred: 14 ml Amount supplemented: 30 EBM via bottle  Totals: Total amount transferred: 42 ml Total supplement given: 30 ml EBM via bottle Total amount pumped post feed: 79 ml   Plan:  1. Offer the breast with feeding cues, try for 3-4 times a day Limit breast feeding to 20 minutes until infant is more active at the breast 2. Feed infant skin to skin 3. Keep infant awake at the breast as needed to keep infant active at the breast 4. Massage/compress breast with feeding as needed to keep infant active at the breast 5. Use the # 24 Nipple Shield with feeding to keep infant latched, Goal is to wean off as infant is able.  5. Offer both breasts with each feeding, empty the first breast before offering the second breast 6. Offer infant a bottle after breast feeding until we see he is transferring better 7. Feed infant using the paced bottle feeding method (Video on [email or web address]) 8. Continue to use a slow flow nipple for feeding 9. Infant needs about 62-83 (2-3 ounces) for 8 feeds a day or 495-660 ml (17-22 ounces) in 24 hours.  Infant may take more or less depending on how often he feeds. Feed infant until he is satisfied.  10. Continue pumping about 8 times a day for 15-20 minutes to promote and protect your milk supply. A hands free bra and massage with pumping may help with increasing supply. 11. Try reverse pressure softening prior to pumping to help with softening breast prior to pumping.  11. Keep up the good work 12. Thank you for allowing me to assist you today 13. Please call with any questions or concerns as needed [phone] 14. Follow up with Lactation in 1 week.    Silas Flood Curran Lenderman RN,  IBCLC                                                      Trask Vosler S Tiona Ruane 09/22/2019, 10:20 AM

## 2019-09-23 ENCOUNTER — Encounter: Payer: Self-pay | Admitting: Family Medicine

## 2019-09-23 ENCOUNTER — Telehealth (INDEPENDENT_AMBULATORY_CARE_PROVIDER_SITE_OTHER): Payer: 59 | Admitting: Family Medicine

## 2019-09-23 ENCOUNTER — Other Ambulatory Visit: Payer: Self-pay

## 2019-09-23 DIAGNOSIS — L309 Dermatitis, unspecified: Secondary | ICD-10-CM | POA: Diagnosis not present

## 2019-09-23 MED ORDER — TRIAMCINOLONE ACETONIDE 0.1 % EX CREA: 1.0000 "application " | TOPICAL_CREAM | Freq: Two times a day (BID) | CUTANEOUS | 1 refills | Status: DC

## 2019-09-23 NOTE — Progress Notes (Signed)
This visit type was conducted due to national recommendations for restrictions regarding the COVID-19 pandemic in an effort to limit this patient's exposure and mitigate transmission in our community.   Virtual Visit via Video Note  I connected with Jodi Adams on 09/23/19 at  1:30 PM EST by a video enabled telemedicine application and verified that I am speaking with the correct person using two identifiers.  Location patient: home Location provider:work or home office Persons participating in the virtual visit: patient, provider  I discussed the limitations of evaluation and management by telemedicine and the availability of in person appointments. The patient expressed understanding and agreed to proceed.   HPI: Jodi Adams has rash involving antecubital fossa both upper extremities and diffusely on her thighs legs and feet.  She describes this as a scaly somewhat erythematous rash.  She had some rash throughout her pregnancy including the last month of pregnancy and she ended up delivering on the 15th.  Her labor and delivery were uncomplicated.  She has a son from recent pregnancy and also a daughter from her first pregnancy.  She is breast-feeding.  She states that her current rash is somewhat of a burning quality and somewhat itching.  She has tried oral Benadryl and over-the-counter hydrocortisone cream without much improvement.  Denies any change in soaps or detergents.  No new medications.   ROS: See pertinent positives and negatives per HPI.  Past Medical History:  Diagnosis Date  . Anxiety   . Chronic nausea   . Depression   . IBS (irritable bowel syndrome)    ulcers  . Meningitis 2005   encephalitis  . Pelvic pain     Past Surgical History:  Procedure Laterality Date  . DIAGNOSTIC LAPAROSCOPY  2012   negative endo and hysteroscopy    Family History  Problem Relation Age of Onset  . Hypertension Father   . Heart disease Father   . Thyroid disease Father   .  Hypertension Brother   . Breast cancer Maternal Grandmother   . Depression Maternal Grandmother        manic  . Diabetes Maternal Grandfather   . Hypertension Maternal Grandfather   . Heart disease Maternal Grandfather   . Breast cancer Paternal Grandmother   . Bipolar disorder Mother     SOCIAL HX: Non-smoker   Current Outpatient Medications:  .  acetaminophen (TYLENOL) 325 MG tablet, Take 2 tablets (650 mg total) by mouth every 4 (four) hours as needed (for pain scale < 4)., Disp: 30 tablet, Rfl: 1 .  Cholecalciferol (VITAMIN D) 50 MCG (2000 UT) tablet, Take 2,000 Units by mouth daily., Disp: , Rfl:  .  diphenhydrAMINE (BENADRYL) 25 MG tablet, Take 25 mg by mouth at bedtime., Disp: , Rfl:  .  famotidine (PEPCID) 20 MG tablet, Take 20 mg by mouth 2 (two) times daily as needed for heartburn or indigestion., Disp: , Rfl:  .  hydrocortisone cream 1 %, Apply 1 application topically 2 (two) times daily., Disp: , Rfl:  .  ibuprofen (ADVIL) 600 MG tablet, Take 1 tablet (600 mg total) by mouth every 6 (six) hours., Disp: 30 tablet, Rfl: 0 .  pantoprazole (PROTONIX) 40 MG tablet, Take 40 mg by mouth daily., Disp: , Rfl:  .  Prenatal Vit-Fe Fumarate-FA (PRENATAL MULTIVITAMIN) TABS tablet, Take 1 tablet by mouth daily at 12 noon., Disp: , Rfl:  .  triamcinolone cream (KENALOG) 0.1 %, Apply 1 application topically 2 (two) times daily., Disp: 45 g, Rfl: 1  EXAM:  VITALS per patient if applicable:  GENERAL: alert, oriented, appears well and in no acute distress  HEENT: atraumatic, conjunttiva clear, no obvious abnormalities on inspection of external nose and ears  NECK: normal movements of the head and neck  LUNGS: on inspection no signs of respiratory distress, breathing rate appears normal, no obvious gross SOB, gasping or wheezing  CV: no obvious cyanosis  MS: moves all visible extremities without noticeable abnormality  PSYCH/NEURO: pleasant and cooperative, no obvious depression or  anxiety, speech and thought processing grossly intact  ASSESSMENT AND PLAN:  Discussed the following assessment and plan:  Skin rash mostly involving lower extremities.  She describes this as scaly and slightly erythematous.  This sounds more eczematous.  -Recommend trial of triamcinolone 0.1% cream to use twice daily as needed. -If not clearing over the next couple weeks recommend office follow-up to further assess     I discussed the assessment and treatment plan with the patient. The patient was provided an opportunity to ask questions and all were answered. The patient agreed with the plan and demonstrated an understanding of the instructions.   The patient was advised to call back or seek an in-person evaluation if the symptoms worsen or if the condition fails to improve as anticipated.     Carolann Littler, MD

## 2019-09-30 ENCOUNTER — Ambulatory Visit: Payer: Self-pay

## 2019-09-30 NOTE — Lactation Note (Signed)
This note was copied from a baby's chart. Lactation Consultation Note  Patient Name: Jodi Adams Today's Date: 09/30/2019     09/30/2019  Name: Irean Kendricks MRN: [id number] Date of Birth: 09/15/2017 Gestational Age: Gestational Age: [redacted]w[redacted]d Birth Weight: 122.4 oz Weight today:  Weight: 7 lb 13.4 oz (2969 g)   62 week old infant presents with mom for follow up feeding assessment.   Infant has gained 252 grams in the last 8 days with an average daily weight gain of 32 grams a day.   Mom is having difficulty with her left breast being sore and her axilla swelling again this morning. She is pumping regularly and noted the swelling has returned. Her milk supply decreased from pumping 4 ounces to  2.5 ounces recently.    Mom reports infant has not been latching as well and that he is taking a bottle well. She is using the Dr. Saul Fordyce bottle. Infant is clicking and smacking a lot on the bottle. He is choking more on the bottle, in the middle of the feeding when most active, he is also drooling on the nipple. They are using the level 1 nipples. Advised mom to try the Preemie nipple.   Infant popping on and off the breast. Mom reports her nipples have healed and she has weaned off the NS, he is however not sustaining a good latch, infant has been refusing the breast more. [place] tooth has erupted a little more since last week. Reviewed continuing the NS use until he is sustaining latch better.   Infant with thick labial frenulum that inserts at the bottom of the gum ridge. Upper lip tight with flanging, mom flanges on the breast as needed. Infant with recessed chin and sucking blister to upper center lip. Infant with short posterior lingual frenulum. Infant with some snapback with initial suckling and some smacking on the breast. Infant is more alert at the breast today. He is starting to be awake more. He did not sustain latch with the # 24 Nipple shield much better. Infant did not  transfer well today with or without the nipple shield. Reviewed that it may be helpful to have infant evaluated by Oral Specialist since not making good progress in feeding at this time. Weight gain is better than it was.   Mom does not seem to be able to empty her breasts with the baby or the pump. Reviewed trying the Sunflower Lecithin and if milk supply does not seem to increase, she can try the Fenugreek. Dosage and side effects reviewed. She has taken with first pregnancy. Mom has some redness to the right breast on the inner aspect of the breast. She does have a lump to left lower quadrant of the breast. Reviewed care of plugged ducts. She is using the # 24 and # 21 flanges. Enc mom to increase back to # 24 flanges for pumping to see if that helps with emptying the breast. She reports she does sleep on her left breast.   Mom reports stools are more green in color. Due to the fact that mom is not able to empty breasts well, he may be getting more Foremilk with feedings.   Parents are in the middle of moving also so stress may be adding to her situation. Mom informed to call OB if she develops a fever or flu like symptoms.   Mom reports she is feeling stressed, reviewed self care activities as she is able. She reports she is not drinking  well, enc her to drink each time she sits down to pump.   Infant to follow up with Dr. Abran Cantor on March 8. Infant to follow up with Lactation     General Information: Mother's reason for visit: Follow up Feeding assessment, slow weight gain in the infant Consult: Follow-up Lactation consultant: Noralee Stain RN,IBCLC Breastfeeding experience: Not latching well, weaned off the NS. Maternal medical conditions: (PPD, PPH with first infant. Infertility with first infant, dad with Pituitary Tumor) Maternal medications: Pre-natal vitamin, Motrin (ibuprofen)  Breastfeeding History: Frequency of breast feeding: Attempting a few times a day Duration of feeding: few  sucks  Supplementation: Supplement method: bottle(Dr. Brown's Level 1 nipple)   Formula volume: 2.5-3.5 ounces Formula frequency: 2 x in the last 2 weeks   Breast milk volume: 2.5-3.5 ounces Breast milk frequency: every 3 hours   Pump type: Medela pump in style Pump frequency: every 3 hours Pump volume: 2-2.5 ounces, decreased from 4 ounces  Infant Output Assessment: Voids per 24 hours: 8+ Urine color: Clear yellow Stools per 24 hours: 6+ Stool color: Yellow  Breast Assessment: Breast: Filling, Compressible Nipple: Erect Pain level: 0 Pain interventions: Bra, Nipple shield, Breast pump  Feeding Assessment: Infant oral assessment: Variance Infant oral assessment comment: see note Positioning: Cross cradle Latch: 1 - Repeated attempts needed to sustain latch, nipple held in mouth throughout feeding, stimulation needed to elicit sucking reflex. Audible swallowing: 1 - A few with stimulation Type of nipple: 2 - Everted at rest and after stimulation Comfort: 2 - Soft/non-tender Hold: 2 - No assistance needed to correctly position infant at breast LATCH score: 8 Latch assessment: Deep Lips flanged: Yes Suck assessment: Displays both   Pre-feed weight: 3554 grams/3570 grams Post feed weight: 3562 grams/3572 grams Amount transferred: 8 ml/2 ml Amount supplemented: 0  Additional Feeding Assessment: Infant oral assessment: Variance Infant oral assessment comment: see note Positioning: Cross cradle(left breast, 10 minutes) Latch: 2 - Grasps breast easily, tongue down, lips flanged, rhythmical sucking. Audible swallowing: 2 - Spontaneous and intermittent Type of nipple: 2 - Everted at rest and after stimulation Comfort: 1 - Filling, red/small blisters or bruises, mild/mod discomfort Hold: 2 - No assistance needed to correctly position infant at breast LATCH score: 9 Latch assessment: Deep Lips flanged: Yes Suck assessment: Displays both Tools: Nipple shield 24  mm Pre-feed weight: 3562 grams Post feed weight: 3570 grams Amount transferred: 8 ml Amount supplemented: 60 ml EBM with bottle  Totals: Total amount transferred: 18 ml Total supplement given: 60 ml EBM via bottle Total amount pumped post feed: did not pump   Plan: 1. Offer the breast with feeding cues, try for 3-4 times a day Limit breast feeding to 20 minutes until infant is more active at the breast 2. Feed infant skin to skin 3. Keep infant awake at the breast as needed to keep infant active at the breast 4. Massage/compress breast with feeding as needed to keep infant active at the breast 5. Use the # 24 Nipple Shield with feeding to keep infant latched, Goal is to wean off as infant is able. Use the nipple shield with feeding as needed to maintain latch 5. Offer both breasts with each feeding, empty the first breast before offering the second breast 6. Offer infant a bottle after breast feeding until we see he is transferring better 7. Feed infant using the paced bottle feeding method (Video on [email or web address]) 8. Would recommend that you try the Dr. Theora Gianotti Preemie Nipple for feeding 9.  Infant needs about 66-88 (2.5-3 ounces) for 8 feeds a day or 525-700 ml (18-23 ounces) in 24 hours. Infant may take more or less depending on how often he feeds. Feed infant until he is satisfied.  10. Continue pumping about 8 times a day for 15-20 minutes to promote and protect your milk supply. A hands free bra and massage with pumping may help with increasing supply. 11. Apply warm compresses for 10-20 minutes to left breast prior to pumping or feeding  12. Empty the breast every 2-3 hours 13. Rest when you can 14. Call your OB if you develop a fever or flu like symptoms 15. Sunflower Lecithin 1200 mg 4 times a day may be helpful for your plugs (Breakfast, lunch, dinner, and bedtime) until plugs are resolved then decrease to 2 times a day.  16. Would recommend having infant evaluated by Oral  Specialist 17. Keep up the good work 18. Thank you for allowing me to assist you today 19. Please call with any questions or concerns as needed [phone] 20. Follow up with Lactation in 2 weeks    Ed Blalock RN, IBCLC                                                       Ed Blalock 09/30/2019, 10:39 AM

## 2019-10-23 ENCOUNTER — Ambulatory Visit: Payer: Self-pay

## 2019-10-23 NOTE — Lactation Note (Signed)
This note was copied from a baby's chart. Lactation Consultation Note  Patient Name: Jodi Adams Today's Date: 10/23/2019     10/23/2019  Name: Jodi Adams MRN: [id number] Date of Birth: 09/15/2017 Gestational Age: Gestational Age: [redacted]w[redacted]d Birth Weight: 122.4 oz Weight today:  Weight: 9 lb 7.3 oz (1865 g)   36 week old term infant presents today with mom for feeding assessment. Infant post tongue and lip releases with Dr. Orland Mustard on Monday 3/22.   Infant has been evaluated by Dr. Nicholes Rough for his natal tooth and they are monitoring for now.   Infant has gained 734 grams in the last 23 days with an average daily weight gain of 32 grams a day.   Infant is not latching to the breast in the last few weeks. Mom has been pumping every 3 hours and is getting 1 ounce from the left breast and 1-3 ounces from the right. Infant is being fed 1/2 formula and 1/2 breast milk.   Infant with granulation tissue to upper lip with better lip flangability. Infant with diamond shaped granulation tissue noted and stretches well. Infant suck has improved and less drooling on the bottle per mom. Mom is performing stretches per Dr. Orland Mustard and reports infant is tolerating well. Showed mom suck training to start today.   Mom would like to increase her milk supply. Reviewed galactagogues Fenugreek, Milk Flow Plus, Mother Love More Milk Special Blend, and Reglan. Reviewed side effects and dosages. Reviewed when to stop taking and when to know they are working. Handout given. Reviewed power pumping and allowing a 5-6 hour stretch at night of no pumping to rest.   Infant fed prior to appt. He did not feed in the office. Will follow up in 2 weeks to assess feeding. Enc mom to offer the breast at least a few times a day. Reviewed with mom it is common to take 2-4 weeks to see healing and change in infant feeding behaviors post releases.   Infant to follow up with Dr. Abran Cantor next week. Infant to  follow up with Dr. Orland Mustard via phone  In 2 weeks. Infant to follow up with Lactation in 2 weeks.       General Information: Mother's reason for visit: Follow up feeding assessment, Post tongue and lip releases on 3/22 by Dr. Orland Mustard Consult: Follow-up Lactation consultant: Noralee Stain RN,IBCLC Breastfeeding experience: Not latching at this time Maternal medical conditions: Post-partum hemorrhage, History post partum depression(with first child) Maternal medications: Pre-natal vitamin  Breastfeeding History: Frequency of breast feeding: not latching currently, latched once yesterday    Supplementation: Supplement method: bottle(Dr. Brown's, less drooling noted) Brand: Similac Formula volume: 2.5-3 ounces Formula frequency: 4 x a day   Breast milk volume: 2.5-3 ounces Breast milk frequency: 4 x a day   Pump type: Medela pump in style Pump frequency: every 3 hours Pump volume: 2-4  Infant Output Assessment: Voids per 24 hours: 8+ Urine color: Clear yellow   Stool color: Yellow  Breast Assessment: Breast: Soft, Compressible Nipple: Erect Pain level: 0 Pain interventions: Bra, Breast pump  Feeding Assessment: Infant oral assessment: Variance Infant oral assessment comment: see note Positioning: Cradle(right breast, few minutes) Latch: 1 - Repeated attempts needed to sustain latch, nipple held in mouth throughout feeding, stimulation needed to elicit sucking reflex. Audible swallowing: 1 - A few with stimulation Type of nipple: 2 - Everted at rest and after stimulation Comfort: 2 - Soft/non-tender Hold: 2 - No assistance needed to correctly position  infant at breast LATCH score: 8 Latch assessment: Shallow Lips flanged: Yes Suck assessment: Displays both   Pre-feed weight: 4288 grams Post feed weight: 4292 grams Amount transferred: 4 ml Amount supplemented: infant fed prior to appt  Additional Feeding Assessment:                                     Totals: Total amount transferred: 4 ml Total supplement given: fed prior to appt Total amount pumped post feed: did not pump   Plan:  1. Offer the breast with feeding cues, try for 3-4 times a day Limit breast feeding to 20 minutes until infant is more active at the breast 2. Feed infant skin to skin 3. Keep infant awake at the breast as needed to keep infant active at the breast 4. Massage/compress breast with feeding as needed to keep infant active at the breast 5. Use the # 24 Nipple Shield with feeding to keep infant latched, Goal is to wean off as infant is able. Use the nipple shield with feeding as needed to maintain latch 5. Offer both breasts with each feeding, empty the first breast before offering the second breast 6. Offer infant a bottle after breast feeding until we see he is transferring better 7. Feed infant using the paced bottle feeding method (Video on [email or web address]) 8. Would recommend that you try the Dr. Saul Fordyce Preemie Nipple for feeding 9. Infant needs about 79-105 (2.5-3.5 ounces) for 8 feeds a day or 630-840 ml (21-28 ounces) in 24 hours. Infant may take more or less depending on how often he feeds. Feed infant until he is satisfied.  10. Continue pumping about 8 times a day for 15-20 minutes to promote and protect your milk supply. A hands free bra and massage with pumping may help with increasing supply. 11. Power Pumping once a day may be helpful (pump for 20 minutes, rest for 10, pump for 10, rest 10 minutes, rest 10 minutes 12. Continue stretches per Dr. Verdene Lennert 13. Suck training 5-6 times a day for 1-2 minutes per exercise, for 2-3 weeks or until suck improves 14. Would recommend having infant evaluated by Oral Specialist 15. Keep up the good work 84. Thank you for allowing me to assist you today 17. Please call with any questions or concerns as needed [phone] 18. Follow up with Lactation in 2 weeks    [place],  IBCLC                                                    Donn Pierini 10/23/2019, 9:36 AM

## 2019-12-01 ENCOUNTER — Other Ambulatory Visit: Payer: Self-pay

## 2019-12-02 ENCOUNTER — Encounter: Payer: Self-pay | Admitting: Family Medicine

## 2019-12-02 ENCOUNTER — Ambulatory Visit (INDEPENDENT_AMBULATORY_CARE_PROVIDER_SITE_OTHER): Payer: 59 | Admitting: Family Medicine

## 2019-12-02 VITALS — BP 120/60 | HR 79 | Temp 98.2°F | Ht 61.75 in | Wt 139.0 lb

## 2019-12-02 DIAGNOSIS — R5383 Other fatigue: Secondary | ICD-10-CM

## 2019-12-02 DIAGNOSIS — R635 Abnormal weight gain: Secondary | ICD-10-CM

## 2019-12-02 DIAGNOSIS — M2550 Pain in unspecified joint: Secondary | ICD-10-CM

## 2019-12-02 LAB — CBC WITH DIFFERENTIAL/PLATELET
Basophils Absolute: 0 10*3/uL (ref 0.0–0.1)
Basophils Relative: 0.5 % (ref 0.0–3.0)
Eosinophils Absolute: 0.2 10*3/uL (ref 0.0–0.7)
Eosinophils Relative: 3 % (ref 0.0–5.0)
HCT: 42.9 % (ref 36.0–46.0)
Hemoglobin: 14.1 g/dL (ref 12.0–15.0)
Lymphocytes Relative: 37.8 % (ref 12.0–46.0)
Lymphs Abs: 1.9 10*3/uL (ref 0.7–4.0)
MCHC: 32.8 g/dL (ref 30.0–36.0)
MCV: 83.4 fl (ref 78.0–100.0)
Monocytes Absolute: 0.4 10*3/uL (ref 0.1–1.0)
Monocytes Relative: 8.4 % (ref 3.0–12.0)
Neutro Abs: 2.5 10*3/uL (ref 1.4–7.7)
Neutrophils Relative %: 50.3 % (ref 43.0–77.0)
Platelets: 177 10*3/uL (ref 150.0–400.0)
RBC: 5.14 Mil/uL — ABNORMAL HIGH (ref 3.87–5.11)
RDW: 20.6 % — ABNORMAL HIGH (ref 11.5–15.5)
WBC: 5 10*3/uL (ref 4.0–10.5)

## 2019-12-02 LAB — BASIC METABOLIC PANEL
BUN: 16 mg/dL (ref 6–23)
CO2: 30 mEq/L (ref 19–32)
Calcium: 10 mg/dL (ref 8.4–10.5)
Chloride: 103 mEq/L (ref 96–112)
Creatinine, Ser: 0.81 mg/dL (ref 0.40–1.20)
GFR: 81.87 mL/min (ref >60.00)
Glucose, Bld: 83 mg/dL (ref 70–99)
Potassium: 4.2 mEq/L (ref 3.5–5.1)
Sodium: 138 mEq/L (ref 135–145)

## 2019-12-02 LAB — TSH: TSH: 2.01 u[IU]/mL (ref 0.35–4.50)

## 2019-12-02 LAB — VITAMIN D 25 HYDROXY (VIT D DEFICIENCY, FRACTURES): VITD: 32.84 ng/mL (ref 30.00–100.00)

## 2019-12-02 LAB — C-REACTIVE PROTEIN: CRP: <1 mg/dL (ref 0.5–20.0)

## 2019-12-02 LAB — SEDIMENTATION RATE: Sed Rate: 21 mm/hr — ABNORMAL HIGH (ref 0–20)

## 2019-12-02 NOTE — Progress Notes (Signed)
Subjective:     Patient ID: Jodi Adams, female   DOB: 1985-09-18, 34 y.o.   MRN: [id number]  HPI Arieliz is seen with complaint of polyarthralgias and fatigue.  There is some question of fibromyalgia years ago but this was not clear.  Her current complaints center more on joint pain.  She gives as an example bilateral wrist pain, bilateral ankle stiffness and pain, occasional hip pain, neck pain, and thumb pain particularly at the [facility] and MCP joints of both thumbs.  She does not see any visible swelling or redness or warmth.  She has early morning stiffness.  She is not taking any medications after Tylenol.  She states her dad was recently diagnosed with polymyalgia rheumatica.  There is no known family history of rheumatoid arthritis.  She denies any recent tick bites.  She had a rash with her recent pregnancy but this was more eczematous.  No history of erythema migrans type rash.  She is not aware of any family history of ankylosing spondylitis.  Past Medical History:  Diagnosis Date  . Anxiety   . Chronic nausea   . Depression   . IBS (irritable bowel syndrome)    ulcers  . Meningitis 2005   encephalitis  . Pelvic pain    Past Surgical History:  Procedure Laterality Date  . DIAGNOSTIC LAPAROSCOPY  2012   negative endo and hysteroscopy    reports that she has never smoked. She has never used smokeless tobacco. She reports previous alcohol use. She reports that she does not use drugs. family history includes Bipolar disorder in her mother; Breast cancer in her maternal grandmother and paternal grandmother; Depression in her maternal grandmother; Diabetes in her maternal grandfather; Heart disease in her father and maternal grandfather; Hypertension in her brother, father, and maternal grandfather; Thyroid disease in her father. Allergies  Allergen Reactions  . Lactose Intolerance (Gi) Diarrhea  . Ceclor [Cefaclor] Rash     Review of Systems  Constitutional: Positive for fatigue.  Negative for appetite change and unexpected weight change.  Respiratory: Negative for cough and shortness of breath.   Cardiovascular: Negative for chest pain.  Gastrointestinal: Negative for abdominal pain.  Genitourinary: Negative for dysuria.  Musculoskeletal: Positive for arthralgias, neck pain and neck stiffness. Negative for joint swelling.  Skin: Negative for rash.  Neurological: Negative for dizziness and weakness.  Hematological: Negative for adenopathy.       Objective:   Physical Exam Vitals reviewed.  Constitutional:      Appearance: Normal appearance.  Cardiovascular:     Rate and Rhythm: Normal rate and regular rhythm.  Pulmonary:     Effort: Pulmonary effort is normal.     Breath sounds: Normal breath sounds.  Musculoskeletal:     Cervical back: Neck supple.     Right lower leg: No edema.     Left lower leg: No edema.     Comments: No evidence for any joint swelling, erythema, or warmth  Lymphadenopathy:     Cervical: No cervical adenopathy.  Neurological:     Mental Status: She is alert.        Assessment:     Ranetta presents with progressive polyarthralgias involving multiple joints.  Etiology unclear.  She also is having some nonspecific fatigue issues.  She does not have any evidence for objective swelling at this time.    Plan:     -Check labs including sed rate, C-reactive protein, CBC, rheumatoid factor, ANA, CCP antibody, Lyme antibodies, TSH, vitamin D level  Eulas Post MD [facility]

## 2019-12-02 NOTE — Patient Instructions (Signed)
Joint Pain Joint pain (arthralgia) may be caused by many things. Joint pain is likely to go away when you follow instructions from your health care provider for relieving pain at home. However, joint pain can also be caused by conditions that require more treatment. Common causes of joint pain include:  Bruising in the area of the joint.  Injury caused by repeating certain movements too many times (overuse injury).  Age-related joint wear and tear (osteoarthritis).  Buildup of uric acid crystals in the joint (gout).  Inflammation of the joint (rheumatic disease).  Various other forms of arthritis.  Infections of the joint (septic arthritis) or of the bone (osteomyelitis). Your health care provider may recommend that you take pain medicine or wear a supportive device like an elastic bandage, sling, or splint. If your joint pain continues, you may need lab or imaging tests to diagnose the cause of your joint pain. Follow these instructions at home: Managing pain, stiffness, and swelling   If directed, put ice on the painful area. Icing can help to relieve joint pain and swelling. ? Put ice in a plastic bag. ? Place a towel between your skin and the bag. ? Leave the ice on for 20 minutes, 2-3 times a day.  If directed, apply heat to the painful area as often as told by your health care provider. Heat can reduce the stiffness of your muscles and joints. Use the heat source that your health care provider recommends, such as a moist heat pack or a heating pad. ? Place a towel between your skin and the heat source. ? Leave the heat on for 20-30 minutes. ? Remove the heat if your skin turns bright red. This is especially important if you are unable to feel pain, heat, or cold. You may have a greater risk of getting burned.  Move your fingers or toes below the painful joint often. You can avoid stiffness and lessen swelling by doing this.  If possible, raise (elevate) the painful joint above  the level of your heart while you are sitting or lying down. To do this, try putting a few pillows under the painful joint. Activity  Rest the painful joint for as long as directed. Do not do anything that causes or worsens pain.  Begin exercising or stretching the affected area, as told by your health care provider. Ask your health care provider what types of exercise are safe for you. If you have an elastic bandage, sling, or splint:  Wear the supportive device as told by your health care provider. Remove it only as told by your health care provider.  Loosen the device if your fingers or toes below the joint tingle, become numb, or turn cold and blue.  Keep the device clean.  Ask your health care provider if you should remove the device before bathing. You may need to cover it with a watertight covering when you take a bath or a shower. General instructions  Take over-the-counter and prescription medicines only as told by your health care provider.  Do not use any products that contain nicotine or tobacco, such as cigarettes and e-cigarettes. If you need help quitting, ask your health care provider.  Keep all follow-up visits as told by your health care provider. This is important. Contact a health care provider if:  You have pain that gets worse and does not get better with medicine.  Your joint pain does not improve within 3 days.  You have increased bruising or swelling.    You have a fever.  You lose 10 lb (4.5 kg) or more without trying. Get help right away if:  You cannot move the joint.  Your fingers or toes tingle, become numb, or turn cold and blue.  You have a fever along with a joint that is red, warm, and swollen. Summary  Joint pain (arthralgia) may be caused by many things.  Your health care provider may recommend that you take pain medicine or wear a supportive device like an elastic bandage, sling, or splint.  If your joint pain continues, you may need  tests to diagnose the cause of your joint pain.  Take over-the-counter and prescription medicines only as told by your health care provider. This information is not intended to replace advice given to you by your health care provider. Make sure you discuss any questions you have with your health care provider. Document Revised: 06/29/2017 Document Reviewed: 05/02/2017 [employer]vier Patient Education  2020 [employer]  

## 2019-12-07 LAB — B. BURGDORFI ANTIBODIES: B burgdorferi Ab IgG+IgM: <0.9 index

## 2019-12-07 LAB — ANTI-NUCLEAR AB-TITER (ANA TITER): ANA Titer 1: 1:80 {titer} — ABNORMAL HIGH

## 2019-12-07 LAB — CYCLIC CITRUL PEPTIDE ANTIBODY, IGG: Cyclic Citrullin Peptide Ab: <16 UNITS

## 2019-12-07 LAB — RHEUMATOID FACTOR: Rheumatoid fact SerPl-aCnc: <14 IU/mL (ref <14)

## 2019-12-07 LAB — ANA: Anti Nuclear Antibody (ANA): POSITIVE — AB

## 2019-12-08 ENCOUNTER — Telehealth: Payer: 59 | Admitting: Family Medicine

## 2019-12-08 ENCOUNTER — Encounter: Payer: Self-pay | Admitting: Family Medicine

## 2020-08-31 HISTORY — PX: FOOT TENDON SURGERY: SHX958

## 2020-09-01 ENCOUNTER — Other Ambulatory Visit: Payer: Self-pay

## 2020-09-01 ENCOUNTER — Encounter: Payer: Self-pay | Admitting: Family Medicine

## 2020-09-01 ENCOUNTER — Ambulatory Visit: Payer: BC Managed Care – PPO | Admitting: Family Medicine

## 2020-09-01 VITALS — BP 104/64 | HR 80 | Ht 61.75 in | Wt 141.0 lb

## 2020-09-01 DIAGNOSIS — S99921A Unspecified injury of right foot, initial encounter: Secondary | ICD-10-CM | POA: Diagnosis not present

## 2020-09-01 NOTE — Progress Notes (Signed)
Established Patient Office Visit  Subjective:  Patient ID: Jodi Adams, female    DOB: 1985-08-31  Age: 35 y.o. MRN: [id number]  CC:  Chief Complaint  Patient presents with  . Foot Injury    Right side, dropped something on it 3 weeks again    HPI Jodi Adams presents for recent right foot injury.  She believes the injury occurred about 4 weeks ago.  She states that she had a glass in her hand that fell out and fell to the floor and shattered.  She picked up a large shard and then that slipped out of her hand and dropped down onto the dorsum of her right foot.  She had approximately 1 cm laceration over the mid forefoot.  This was fairly gaping.  She did not seek medical care at that time or until now.  About 2 weeks ago she noticed some difficulties with dorsiflexion the right great toe.  She recalls some erythema and even some mild drainage from the foot after the injury.  She does not think there were any foreign bodies in the foot.  She has a little bit of tingling in the toe but no total numbness.  She states her tetanus is up-to-date.  She apparently got booster through GYN.  Past Medical History:  Diagnosis Date  . Anxiety   . Chronic nausea   . Depression   . IBS (irritable bowel syndrome)    ulcers  . Meningitis 2005   encephalitis  . Pelvic pain     Past Surgical History:  Procedure Laterality Date  . DIAGNOSTIC LAPAROSCOPY  2012   negative endo and hysteroscopy    Family History  Problem Relation Age of Onset  . Hypertension Father   . Heart disease Father   . Thyroid disease Father   . Hypertension Brother   . Breast cancer Maternal Grandmother   . Depression Maternal Grandmother        manic  . Diabetes Maternal Grandfather   . Hypertension Maternal Grandfather   . Heart disease Maternal Grandfather   . Breast cancer Paternal Grandmother   . Bipolar disorder Mother     Social History   Socioeconomic History  . Marital status: Married    Spouse  name: Not on file  . Number of children: Not on file  . Years of education: Not on file  . Highest education level: Not on file  Occupational History  . Not on file  Tobacco Use  . Smoking status: Never Smoker  . Smokeless tobacco: Never Used  Substance and Sexual Activity  . Alcohol use: Not Currently  . Drug use: Never  . Sexual activity: Not on file  Other Topics Concern  . Not on file  Social History Narrative  . Not on file   Social Determinants of Health   Financial Resource Strain: Not on file  Food Insecurity: Not on file  Transportation Needs: Not on file  Physical Activity: Not on file  Stress: Not on file  Social Connections: Not on file  Intimate Partner Violence: Not on file    Outpatient Medications Prior to Visit  Medication Sig Dispense Refill  . acetaminophen (TYLENOL) 325 MG tablet Take 2 tablets (650 mg total) by mouth every 4 (four) hours as needed (for pain scale < 4). 30 tablet 1  . Cholecalciferol (VITAMIN D) 50 MCG (2000 UT) tablet Take 2,000 Units by mouth daily.    . diphenhydrAMINE (BENADRYL) 25 MG tablet Take 25 mg by  mouth at bedtime.    . famotidine (PEPCID) 20 MG tablet Take 20 mg by mouth 2 (two) times daily as needed for heartburn or indigestion.    . hydrocortisone cream 1 % Apply 1 application topically 2 (two) times daily.    Marland Kitchen ibuprofen (ADVIL) 600 MG tablet Take 1 tablet (600 mg total) by mouth every 6 (six) hours. 30 tablet 0  . pantoprazole (PROTONIX) 40 MG tablet Take 40 mg by mouth daily.    . Prenatal Vit-Fe Fumarate-FA (PRENATAL MULTIVITAMIN) TABS tablet Take 1 tablet by mouth daily at 12 noon.    . triamcinolone cream (KENALOG) 0.1 % Apply 1 application topically 2 (two) times daily. (Patient not taking: Reported on 12/02/2019) 45 g 1   No facility-administered medications prior to visit.    Allergies  Allergen Reactions  . Lactose Intolerance (Gi) Diarrhea  . Ceclor [Cefaclor] Rash    ROS Review of Systems  Neurological:        See HPI      Objective:    Physical Exam Vitals reviewed.  Constitutional:      Appearance: Normal appearance.  Cardiovascular:     Rate and Rhythm: Normal rate and regular rhythm.  Pulmonary:     Effort: Pulmonary effort is normal.     Breath sounds: Normal breath sounds.  Musculoskeletal:     Comments: Right foot reveals healed scar on the dorsum of the foot.  No surrounding erythema.  No fluctuance.  No foreign bodies palpated.  No bony tenderness.  Neurological:     Mental Status: She is alert.     Comments: She has some definite weakness with dorsiflexion of the right great toe.  Other toes are intact with flexion and extension.  She has normal sensory function with monofilament testing in the right great toe.  No difficulties with dorsiflexion of the right foot     BP 104/64   Pulse 80   Ht 5' 1.75" (1.568 m)   Wt 141 lb (64 kg)   SpO2 98%   BMI 26.00 kg/m  Wt Readings from Last 3 Encounters:  09/01/20 141 lb (64 kg)  12/02/19 139 lb (63 kg)  04/18/19 135 lb (61.2 kg)     Health Maintenance Due  Topic Date Due  . Hepatitis C Screening  Never done  . COVID-19 Vaccine (1) Never done  . TETANUS/TDAP  Never done  . PAP SMEAR-Modifier  08/26/2016  . INFLUENZA VACCINE  Never done    There are no preventive care reminders to display for this patient.  Lab Results  Component Value Date   TSH 2.01 12/02/2019   Lab Results  Component Value Date   WBC 5.0 12/02/2019   HGB 14.1 12/02/2019   HCT 42.9 12/02/2019   MCV 83.4 12/02/2019   PLT 177.0 12/02/2019   Lab Results  Component Value Date   NA 138 12/02/2019   K 4.2 12/02/2019   CO2 30 12/02/2019   GLUCOSE 83 12/02/2019   BUN 16 12/02/2019   CREATININE 0.81 12/02/2019   CALCIUM 10.0 12/02/2019   GFR 81.87 12/02/2019   No results found for: CHOL No results found for: HDL No results found for: LDLCALC No results found for: TRIG No results found for: CHOLHDL No results found for: HENI7P     Assessment & Plan:   Injury with laceration right foot > one month ago.  She did not seek any care immediately afterwards.  She now presents with difficulties fully extending the right  great toe.  She showed [place] pictures of the laceration and this was about 1 cm length and not particularly gaping only mildly gaping.  She did not think this was particularly deep and it was seen that tendon laceration be fairly unlikely.  No signs of persistent infection.  She describes some erythema and swelling afterwards but those changes have resolved.  ? Extensor hallucis longus tendon injury vs deep fibular nerve injury.  -We recommend setting up referral to orthopedic foot specialist.  May need MRI to further ascertain extent of injury.  No orders of the defined types were placed in this encounter.   Follow-up: No follow-ups on file.    Evelena Peat, MD

## 2020-09-01 NOTE — Patient Instructions (Signed)
I will set up referral to orthopedic foot specialist.

## 2020-09-07 ENCOUNTER — Ambulatory Visit: Payer: BC Managed Care – PPO | Admitting: Podiatry

## 2020-09-07 ENCOUNTER — Ambulatory Visit (INDEPENDENT_AMBULATORY_CARE_PROVIDER_SITE_OTHER): Payer: BC Managed Care – PPO

## 2020-09-07 ENCOUNTER — Other Ambulatory Visit: Payer: Self-pay | Admitting: Podiatry

## 2020-09-07 ENCOUNTER — Encounter: Payer: Self-pay | Admitting: Podiatry

## 2020-09-07 ENCOUNTER — Other Ambulatory Visit: Payer: Self-pay

## 2020-09-07 DIAGNOSIS — S90111A Contusion of right great toe without damage to nail, initial encounter: Secondary | ICD-10-CM | POA: Diagnosis not present

## 2020-09-07 DIAGNOSIS — S9031XA Contusion of right foot, initial encounter: Secondary | ICD-10-CM

## 2020-09-07 DIAGNOSIS — S96919A Strain of unspecified muscle and tendon at ankle and foot level, unspecified foot, initial encounter: Secondary | ICD-10-CM | POA: Diagnosis not present

## 2020-09-07 DIAGNOSIS — M792 Neuralgia and neuritis, unspecified: Secondary | ICD-10-CM | POA: Diagnosis not present

## 2020-09-08 NOTE — Progress Notes (Signed)
Subjective:   Patient ID: Jodi Adams, female   DOB: 35 y.o.   MRN: [id number]   HPI 35 year old female presents the office today for concerns of right foot injury.  She said about a month ago she dropped a heavy glass on her foot not broken when she picked her up she dropped it on her foot causing a laceration.  She waited a couple weeks and the wound healed she saw her primary care physician as it was not getting better she is not able to bend her big toe.  She also has numbness to the toe.  She is wearing a regular shoe today.  Still gets discomfort.  She has no other concerns today.   Review of Systems  All other systems reviewed and are negative.  Past Medical History:  Diagnosis Date  . Anxiety   . Chronic nausea   . Depression   . IBS (irritable bowel syndrome)    ulcers  . Meningitis 2005   encephalitis  . Pelvic pain     Past Surgical History:  Procedure Laterality Date  . DIAGNOSTIC LAPAROSCOPY  2012   negative endo and hysteroscopy     Current Outpatient Medications:  .  acetaminophen (TYLENOL) 325 MG tablet, Take 2 tablets (650 mg total) by mouth every 4 (four) hours as needed (for pain scale < 4)., Disp: 30 tablet, Rfl: 1 .  aluminum chloride (DRYSOL) 20 % external solution, Drysol Dab-O-Matic 20 % topical solution, Disp: , Rfl:  .  Cholecalciferol (VITAMIN D) 50 MCG (2000 UT) tablet, Take 2,000 Units by mouth daily., Disp: , Rfl:  .  diphenhydrAMINE (BENADRYL) 25 MG tablet, Take 25 mg by mouth at bedtime., Disp: , Rfl:  .  escitalopram (LEXAPRO) 10 MG tablet, escitalopram 10 mg tablet, Disp: , Rfl:  .  famotidine (PEPCID) 20 MG tablet, Take 20 mg by mouth 2 (two) times daily as needed for heartburn or indigestion., Disp: , Rfl:  .  hydrocortisone cream 1 %, Apply 1 application topically 2 (two) times daily., Disp: , Rfl:  .  ibuprofen (ADVIL) 600 MG tablet, Take 1 tablet (600 mg total) by mouth every 6 (six) hours., Disp: 30 tablet, Rfl: 0 .  metoCLOPramide  (REGLAN) 10 MG tablet, metoclopramide 10 mg tablet  TAKE 1 TABLET BY MOUTH THREE TIMES A DAY AS NEEDED, Disp: , Rfl:  .  ondansetron (ZOFRAN-ODT) 4 MG disintegrating tablet, ondansetron 4 mg disintegrating tablet  DISSOLVE 2 TABLETS ON TONGUE AND SWALLOW EVERY 12 HOURS AS NEEDED, Disp: , Rfl:  .  pantoprazole (PROTONIX) 40 MG tablet, Take 40 mg by mouth daily., Disp: , Rfl:  .  Prenatal Vit-Fe Fumarate-FA (PRENATAL MULTIVITAMIN) TABS tablet, Take 1 tablet by mouth daily at 12 noon., Disp: , Rfl:  .  sulfamethoxazole-trimethoprim (BACTRIM DS) 800-160 MG tablet, Take 1 tablet by mouth 2 (two) times daily., Disp: , Rfl:  .  triamcinolone (KENALOG) 0.1 %, triamcinolone acetonide 0.1 % topical cream  APPLY TO AFFECTED AREA TWICE A DAY, Disp: , Rfl:   Allergies  Allergen Reactions  . Lactose Intolerance (Gi) Diarrhea  . Ceclor [Cefaclor] Rash          Objective:  Physical Exam  General: AAO x3, NAD  Dermatological: Healed ulceration of the dorsal medial aspect of the midfoot just proximal to the first metatarsocuneiform joint however this is on the area the extensor tendon.  There is minimal edema but there is no erythema or warmth.  No drainage or pus or signs  of infection.  Vascular: Dorsalis Pedis artery and Posterior Tibial artery pedal pulses are 2/4 bilateral with immedate capillary fill time. There is no pain with calf compression, swelling, warmth, erythema.   Neruologic: There is numbness associated the medial aspect the right foot particularly the hallux.  Musculoskeletal: Unable to fully dorsiflex her right hallux.  I am able to move the joint through range of motion myself without any restriction or pain.  Muscular strength 5/5 in all groups tested bilateral.  Gait: Unassisted, Nonantalgic.       Assessment:   Concern for EHL laceration right foot, superficial peroneal injury.     Plan:  -Treatment options discussed including all alternatives, risks, and  complications -Etiology of symptoms were discussed -X-rays were obtained and reviewed with the patient.  Evidence of acute fracture or stress fracture identified today. -Given injury and concern for tendon laceration I ordered an MRI to evaluate this further.  Meantime I placed into a cam boot for immobilization for fear of laceration of the tendon.  Discussed treatment options pending the MRI will await the results of the MRI before proceeding with further treatment.  Vivi Barrack DPM

## 2020-09-15 ENCOUNTER — Other Ambulatory Visit: Payer: Self-pay

## 2020-09-15 ENCOUNTER — Ambulatory Visit
Admission: RE | Admit: 2020-09-15 | Discharge: 2020-09-15 | Disposition: A | Discharge status: Home / Self Care | Payer: BC Managed Care – PPO | Source: Ambulatory Visit | Attending: Podiatry | Admitting: Podiatry

## 2020-09-15 DIAGNOSIS — S96919A Strain of unspecified muscle and tendon at ankle and foot level, unspecified foot, initial encounter: Secondary | ICD-10-CM

## 2020-09-15 DIAGNOSIS — S91311A Laceration without foreign body, right foot, initial encounter: Secondary | ICD-10-CM | POA: Diagnosis not present

## 2020-09-23 ENCOUNTER — Ambulatory Visit (INDEPENDENT_AMBULATORY_CARE_PROVIDER_SITE_OTHER): Payer: BC Managed Care – PPO | Admitting: Podiatry

## 2020-09-23 ENCOUNTER — Other Ambulatory Visit: Payer: Self-pay

## 2020-09-23 DIAGNOSIS — T148XXA Other injury of unspecified body region, initial encounter: Secondary | ICD-10-CM

## 2020-09-23 DIAGNOSIS — S96919D Strain of unspecified muscle and tendon at ankle and foot level, unspecified foot, subsequent encounter: Secondary | ICD-10-CM | POA: Diagnosis not present

## 2020-09-23 NOTE — Patient Instructions (Signed)

## 2020-09-27 ENCOUNTER — Telehealth: Payer: Self-pay

## 2020-09-27 NOTE — Progress Notes (Signed)
Subjective: 35 year old female presents the office for surgical consultation.  She previous had injury to her right foot when she dropped a heavy piece of glass on her foot causing a laceration.  She had an MRI performed which did reveal complete EHL rupture.  I had called her discussed the MRI results and she presents today for further evaluation and to discuss treatment options. Denies any systemic complaints such as fevers, chills, nausea, vomiting. No acute changes since last appointment, and no other complaints at this time.   Objective: AAO x3, NAD DP/PT pulses palpable bilaterally, CRT less than 3 seconds Lacerations well-healed with a scar.  She is still not able to actively dorsiflex her hallux and she gets numbness along the big toe on the medial aspect of her foot as well.  No significant edema.  No pain with calf compression, swelling, warmth, erythema  Assessment: 35 year old female with EHL laceration, concern for nerve injury  Plan: -All treatment options discussed with the patient including all alternatives, risks, complications.  -I reviewed the MRI with her.  Given her age not able to actively dorsiflex the hallux I discussed with her surgical versus conservative treatment.  I recommended surgical intervention repair of the tendon.  After discussion she elects to proceed.  Discussed with her that the nerve issues may or may not resolve and we will have to monitor this. -The incision placement as well as the postoperative course was discussed with the patient. I discussed risks of the surgery which include, but not limited to, infection, bleeding, pain, swelling, need for further surgery, delayed or nonhealing, painful or ugly scar, numbness or sensation changes, over/under correction, recurrence, transfer lesions, further deformity, unable to prepare tendon, DVT/PE, loss of toe/foot. Patient understands these risks and wishes to proceed with surgery. The surgical consent was reviewed  with the patient all 3 pages were signed. No promises or guarantees were given to the outcome of the procedure. All questions were answered to the best of my ability. Before the surgery the patient was encouraged to call the office if there is any further questions. The surgery will be performed at the on an outpatient basis.  Vivi Barrack DPM   -Patient encouraged to call the office with any questions, concerns, change in symptoms.

## 2020-09-27 NOTE — Telephone Encounter (Signed)
DOS 10/06/2020  REPAIR EXTENSOR HALLUX LONGUS TENDON RT - 28086  [employer] 06/30/2020   PLAN DEDUCTIBLE - $250.00 W/ $0.00 REMAINING OUT OF POCKET - $2500.00 W/ $2107.00 REMAINING COPAY $0.00 COINSURANCE - 10%  SPOKE TO ANGIE AT [employer], SHE STATED NO PRECERT REQUIRED FOR CPT [phone]. CALL REF # [id number]

## 2020-10-06 ENCOUNTER — Encounter: Payer: Self-pay | Admitting: Podiatry

## 2020-10-06 ENCOUNTER — Other Ambulatory Visit: Payer: Self-pay | Admitting: Podiatry

## 2020-10-06 ENCOUNTER — Telehealth: Payer: Self-pay | Admitting: Podiatry

## 2020-10-06 ENCOUNTER — Telehealth: Payer: Self-pay

## 2020-10-06 DIAGNOSIS — S96121A Laceration of muscle and tendon of long extensor muscle of toe at ankle and foot level, right foot, initial encounter: Secondary | ICD-10-CM | POA: Diagnosis not present

## 2020-10-06 DIAGNOSIS — S96919D Strain of unspecified muscle and tendon at ankle and foot level, unspecified foot, subsequent encounter: Secondary | ICD-10-CM

## 2020-10-06 DIAGNOSIS — M25571 Pain in right ankle and joints of right foot: Secondary | ICD-10-CM | POA: Diagnosis not present

## 2020-10-06 DIAGNOSIS — Y929 Unspecified place or not applicable: Secondary | ICD-10-CM | POA: Diagnosis not present

## 2020-10-06 DIAGNOSIS — S96919A Strain of unspecified muscle and tendon at ankle and foot level, unspecified foot, initial encounter: Secondary | ICD-10-CM | POA: Diagnosis not present

## 2020-10-06 DIAGNOSIS — W208XXA Other cause of strike by thrown, projected or falling object, initial encounter: Secondary | ICD-10-CM | POA: Diagnosis not present

## 2020-10-06 DIAGNOSIS — S96911S Strain of unspecified muscle and tendon at ankle and foot level, right foot, sequela: Secondary | ICD-10-CM

## 2020-10-06 HISTORY — PX: FOOT TENDON SURGERY: SHX958

## 2020-10-06 MED ORDER — CLINDAMYCIN HCL 300 MG PO CAPS: 300.0000 mg | ORAL_CAPSULE | Freq: Three times a day (TID) | ORAL | 0 refills | Status: DC

## 2020-10-06 MED ORDER — OXYCODONE-ACETAMINOPHEN 5-325 MG PO TABS: 1.0000 | ORAL_TABLET | Freq: Four times a day (QID) | ORAL | 0 refills | Status: DC | PRN

## 2020-10-06 MED ORDER — PROMETHAZINE HCL 25 MG PO TABS: 25.0000 mg | ORAL_TABLET | Freq: Three times a day (TID) | ORAL | 0 refills | Status: DC | PRN

## 2020-10-06 NOTE — Progress Notes (Signed)
Post-operative medications sent 

## 2020-10-06 NOTE — Telephone Encounter (Signed)
Order was placed- shelly can you please follow up on this? Thanks!

## 2020-10-06 NOTE — Telephone Encounter (Signed)
Placed order for knee scooter with [facility]

## 2020-10-06 NOTE — Telephone Encounter (Signed)
Pts husband Simonne Come left message asking about the rx for the scooter. He was not sure if he needs to pick it up or what.  Please call pts husband and advise.

## 2020-10-07 ENCOUNTER — Encounter: Payer: Self-pay | Admitting: Podiatry

## 2020-10-07 ENCOUNTER — Other Ambulatory Visit: Payer: Self-pay | Admitting: Podiatry

## 2020-10-07 ENCOUNTER — Telehealth: Payer: Self-pay | Admitting: Podiatry

## 2020-10-07 MED ORDER — IBUPROFEN 800 MG PO TABS: 800.0000 mg | ORAL_TABLET | Freq: Three times a day (TID) | ORAL | 0 refills | Status: DC | PRN

## 2020-10-07 MED ORDER — OXYCODONE-ACETAMINOPHEN 10-325 MG PO TABS: 1.0000 | ORAL_TABLET | ORAL | 0 refills | Status: DC | PRN

## 2020-10-07 NOTE — Telephone Encounter (Signed)
Patient husband called and stated that his wife had sx yesterday with Dr. Ardelle Anton and Harvin Hazel is in so much pain this morning, so much that she is about to throw up. She has been taken 2 oxy every 6 hours plus an ibuprofen on top that. Her husband doesn't know what to do and he needs someone to give her husband a call at [phone].

## 2020-10-07 NOTE — Progress Notes (Signed)
Patient called on-call provider out of concerns for out-of-control pain that has not relieved with even the higher pain medication that was sent in today. I discussed for a short period of time we can do a tablet and a half so long as she does not exceed 3000 mg of Tylenol per day. I discussed that should she have continued pain I can call on a stronger version of pain medicine in the morning. Reaffirmed icing and elevating. Patient to  take ibuprofen as well as prescribed. Patient to call in the morning if she has continued concerns

## 2020-10-07 NOTE — Telephone Encounter (Signed)
Ok great, thank you. 

## 2020-10-07 NOTE — Telephone Encounter (Signed)
I have called and spoken with Jodi Adams, her husband. The pain suddenly came on this morning. She took 2 pain pills at 7:30am and 600mg  Ibuprofen. I instructed to loosen the bandage, continue to ice/elevate. I have sent percocet 10/325 1 eery 4 hours prn and ibuprofen 800mg  TID prn. We will call her back later today to see how she is doing.

## 2020-10-08 ENCOUNTER — Other Ambulatory Visit: Payer: Self-pay | Admitting: Podiatry

## 2020-10-08 ENCOUNTER — Telehealth: Payer: Self-pay | Admitting: Podiatry

## 2020-10-08 MED ORDER — OXYCODONE HCL 15 MG PO TABS: 15.0000 mg | ORAL_TABLET | Freq: Four times a day (QID) | ORAL | 0 refills | Status: DC | PRN

## 2020-10-08 MED ORDER — OXYCODONE HCL 10 MG PO TABS
10.0000 mg | ORAL_TABLET | ORAL | 0 refills | Status: DC | PRN
Start: 2020-10-08 — End: 2020-10-08

## 2020-10-08 NOTE — Progress Notes (Signed)
Received call that Jodi Adams is doing a little better but called Dr. Samuella Cota overnight. She was still in a lot of pain. She was told that she couldn't increase her tylenol anymore and she is requesting pain medication without tylenol. I sent oxycodone to the pharmacy. She can alternative ibuprofen/tylenol as needed in between. Continue to ice/elevate. I think her nerve block wore off yesterday morning and had a sudden increase in pain and it has been difficult to "catch up" to it. Hopefully over the next day or so it will improve.   Also discuss that we can change the bandage to make sure that it is not too tight.

## 2020-10-08 NOTE — Telephone Encounter (Signed)
Called patient today and stated that I was calling to see how patient was doing after having surgery with Dr Ardelle Anton and patient stated that she had talked to Dr Samuella Cota last night and that Dr Samuella Cota was going to send over another type of pain medicine but I talked to Dr Ardelle Anton this morning and he went ahead and sent over pain medicine with out tylenol in it and the patient stated that she could not sleep at all last night and that the nerve block had wore off and did have some nausea with the pain medicine but was better today and was icing the foot as well as the knee area and did take the boot and band age off. I stated to the patient that the next few days could be bad but the 3rd or 4th day would be a little better and to keep icing and elevating and to call the office if any concerns or questions and that we would see the patient next week for a first post op visit. Misty Stanley

## 2020-10-08 NOTE — Telephone Encounter (Signed)
Pharmacy has requested return call for clarification for the meds that were prescribed for patient, Please Advise  Ashwani [phone]

## 2020-10-08 NOTE — Progress Notes (Signed)
Received call from pharmacist about oxycodone 10mg , which is what she was on previously. I have cancelled that order and sent over oxycodone 15mg  1 every 6 hours prn.

## 2020-10-11 ENCOUNTER — Ambulatory Visit (INDEPENDENT_AMBULATORY_CARE_PROVIDER_SITE_OTHER): Payer: BC Managed Care – PPO | Admitting: Podiatry

## 2020-10-11 ENCOUNTER — Telehealth: Payer: Self-pay | Admitting: *Deleted

## 2020-10-11 ENCOUNTER — Other Ambulatory Visit: Payer: Self-pay

## 2020-10-11 ENCOUNTER — Ambulatory Visit: Payer: Self-pay

## 2020-10-11 DIAGNOSIS — Z9889 Other specified postprocedural states: Secondary | ICD-10-CM

## 2020-10-11 DIAGNOSIS — S96919D Strain of unspecified muscle and tendon at ankle and foot level, unspecified foot, subsequent encounter: Secondary | ICD-10-CM

## 2020-10-13 DIAGNOSIS — F419 Anxiety disorder, unspecified: Secondary | ICD-10-CM | POA: Diagnosis not present

## 2020-10-13 NOTE — Progress Notes (Signed)
Subjective: Jodi Adams is a 35 y.o. is seen today in office s/p right foot extensor hallucis longus tendon repair preformed on 10/06/2020. They state their pain is improving and she seems to be in better spirits today.  She is not taking any pain medication today. Denies any systemic complaints such as fevers, chills, nausea, vomiting. No calf pain, chest pain, shortness of breath.   Objective: General: No acute distress, AAOx3  DP/PT pulses palpable 2/4, CRT < 3 sec to all digits.  Protective sensation intact. Motor function intact.  RIGHT foot: Incision is well coapted without any evidence of dehiscence with sutures intact. There is no surrounding erythema, ascending cellulitis, fluctuance, crepitus, malodor, drainage/purulence. There is mild edema around the surgical site. There is mild pain along the surgical site.  The toe is in rectus position. No other areas of tenderness to bilateral lower extremities.  No other open lesions or pre-ulcerative lesions.  No pain with calf compression, swelling, warmth, erythema.   Assessment and Plan:  Status post right foot EHL repair, doing well with no complications   -Treatment options discussed including all alternatives, risks, and complications -Antibiotic ointment was applied followed by dressing.  Keep the dressing clean, dry, intact. -Remain in cam boot.  Nonweightbearing. -Ice/elevation -Pain medication as needed. -Monitor for any clinical signs or symptoms of infection and DVT/PE and directed to call the office immediately should any occur or go to the ER. -Follow-up as scheduled or sooner if any problems arise. In the meantime, encouraged to call the office with any questions, concerns, change in symptoms.   Ovid Curd, DPM

## 2020-10-14 ENCOUNTER — Telehealth: Payer: Self-pay

## 2020-10-14 ENCOUNTER — Telehealth: Payer: Self-pay | Admitting: *Deleted

## 2020-10-14 NOTE — Telephone Encounter (Signed)
Patient returned call stating that she does not want to renew her pain medicine and husband has told the pharmacy as well. There must be an error in their system.Please call her if you would like for her to contact pharmacy again.

## 2020-10-14 NOTE — Telephone Encounter (Signed)
We received several faxes from [facility] on [address] requesting a  Change/refill of pain medication. I called patient to find out if she was requesting pain medicine or a change in medication. Left a detailed message to please call us back to clarify

## 2020-10-14 NOTE — Telephone Encounter (Signed)
error 

## 2020-10-19 DIAGNOSIS — F419 Anxiety disorder, unspecified: Secondary | ICD-10-CM | POA: Diagnosis not present

## 2020-10-21 ENCOUNTER — Ambulatory Visit (INDEPENDENT_AMBULATORY_CARE_PROVIDER_SITE_OTHER): Payer: BC Managed Care – PPO | Admitting: Podiatry

## 2020-10-21 ENCOUNTER — Other Ambulatory Visit: Payer: Self-pay

## 2020-10-21 DIAGNOSIS — S96919D Strain of unspecified muscle and tendon at ankle and foot level, unspecified foot, subsequent encounter: Secondary | ICD-10-CM

## 2020-10-21 DIAGNOSIS — Z9889 Other specified postprocedural states: Secondary | ICD-10-CM

## 2020-10-21 MED ORDER — OXYCODONE-ACETAMINOPHEN 5-325 MG PO TABS: 1.0000 | ORAL_TABLET | Freq: Four times a day (QID) | ORAL | 0 refills | Status: DC | PRN

## 2020-10-24 NOTE — Progress Notes (Signed)
Subjective: Jodi Adams is a 35 y.o. is seen today in office s/p right foot extensor hallucis longus tendon direct repair preformed on 10/06/2020.  States that she still been in discomfort mostly at nighttime.  She takes pain medication at night.  She has been nonweightbearing.  No recent or falls.  She does admit that she is probably on her feet more than she should be.  Denies any fevers, chills, nausea, vomiting.  No calf pain, chest pain, shortness of breath.   Objective: General: No acute distress, AAOx3  DP/PT pulses palpable 2/4, CRT < 3 sec to all digits.  Protective sensation intact. Motor function intact.  RIGHT foot: Incision is well coapted without any evidence of dehiscence with sutures intact. There is no surrounding erythema, ascending cellulitis, fluctuance, crepitus, malodor, drainage/purulence. There is minimal edema around the surgical site. There is mild pain along the surgical site.  No other areas of tenderness to bilateral lower extremities.  No other open lesions or pre-ulcerative lesions.  No pain with calf compression, swelling, warmth, erythema.   Assessment and Plan:  Status post right foot EHL repair, doing well with no complications   -Treatment options discussed including all alternatives, risks, and complications -Sutures removed today without complications.  Steri-Strips applied for reinforcement.  Antibiotic ointment applied followed by dressing.  She can start to shower and wash the foot with soap and water daily.  Dry thoroughly and apply a similar bandage. -Continue cam boot, nonweightbearing.  Ice elevation. -Pain medication as needed-refilled -Monitor for any clinical signs or symptoms of infection and DVT/PE and directed to call the office immediately should any occur or go to the ER. -Follow-up as scheduled or sooner if any problems arise. In the meantime, encouraged to call the office with any questions, concerns, change in symptoms.   *Next appointment  if she is doing well started transition to partial weightbearing in the cam boot.  Encourage range of motion exercises of the first MPJ.  Ovid Curd, DPM

## 2020-10-26 DIAGNOSIS — F419 Anxiety disorder, unspecified: Secondary | ICD-10-CM | POA: Diagnosis not present

## 2020-11-03 DIAGNOSIS — F419 Anxiety disorder, unspecified: Secondary | ICD-10-CM | POA: Diagnosis not present

## 2020-11-04 ENCOUNTER — Ambulatory Visit (INDEPENDENT_AMBULATORY_CARE_PROVIDER_SITE_OTHER): Payer: BC Managed Care – PPO | Admitting: Podiatry

## 2020-11-04 ENCOUNTER — Other Ambulatory Visit: Payer: Self-pay

## 2020-11-04 ENCOUNTER — Ambulatory Visit (INDEPENDENT_AMBULATORY_CARE_PROVIDER_SITE_OTHER): Payer: BC Managed Care – PPO

## 2020-11-04 ENCOUNTER — Encounter: Payer: Self-pay | Admitting: Podiatry

## 2020-11-04 DIAGNOSIS — S96919D Strain of unspecified muscle and tendon at ankle and foot level, unspecified foot, subsequent encounter: Secondary | ICD-10-CM

## 2020-11-04 DIAGNOSIS — S96911A Strain of unspecified muscle and tendon at ankle and foot level, right foot, initial encounter: Secondary | ICD-10-CM

## 2020-11-04 NOTE — Progress Notes (Signed)
Subjective:   Patient ID: Jodi Adams, female   DOB: 35 y.o.   MRN: [id number]   HPI Patient of Dr. Shella Spearing who presents with approximate 4 weeks after having extensor tendon repair of the right foot   ROS      Objective:  Physical Exam  Neurovascular status intact negative Denna Haggard' sign noted wound edges well coapted in the midfoot right from having had a extensor tendon repair with minimal hallux motion at the current time extensor wise and patient has been nonweightbearing up to this point     Assessment:  Difficult to make complete determination is very early in the process but it does appear that things are progressing well and the incision is healing well     Plan:  Reviewed with patient and we will begin partial weightbearing with crutch with boot left and then gradual increase in activities with boot and will be seen back by Dr. Shella Spearing 3 weeks.  May require physical therapy which I explained to her today and encouraged to call with any questions concerns which may arise

## 2020-11-11 DIAGNOSIS — F419 Anxiety disorder, unspecified: Secondary | ICD-10-CM | POA: Diagnosis not present

## 2020-11-15 ENCOUNTER — Encounter: Payer: BC Managed Care – PPO | Admitting: Podiatry

## 2020-11-20 ENCOUNTER — Ambulatory Visit (INDEPENDENT_AMBULATORY_CARE_PROVIDER_SITE_OTHER): Payer: BC Managed Care – PPO | Admitting: Podiatry

## 2020-11-20 ENCOUNTER — Other Ambulatory Visit: Payer: Self-pay

## 2020-11-20 ENCOUNTER — Encounter: Payer: Self-pay | Admitting: Podiatry

## 2020-11-20 DIAGNOSIS — Z9889 Other specified postprocedural states: Secondary | ICD-10-CM

## 2020-11-20 DIAGNOSIS — S96919D Strain of unspecified muscle and tendon at ankle and foot level, unspecified foot, subsequent encounter: Secondary | ICD-10-CM

## 2020-11-20 NOTE — Progress Notes (Signed)
Subjective: Jodi Adams is a 35 y.o. is seen today in office s/p right foot extensor hallucis longus tendon direct repair preformed on 10/06/2020.  She has been walking in the cam boot.  She states that she cannot use crutches because this was causing her to fall more.  She states that she is now having any discomfort while wearing the boot and has had only minimal discomfort.  She has tried to go for short distances a couple steps at nighttime without the boot which does cause soreness.  No recent injury or falls or changes otherwise.  No new concerns today.  Denies any fevers, chills, nausea, vomiting.  Objective: General: No acute distress, AAOx3  DP/PT pulses palpable 2/4, CRT < 3 sec to all digits.  Protective sensation intact. Motor function intact.  RIGHT foot: Incision is well coapted without any evidence of dehiscence and scar is formed.  There is no significant tenderness palpation on the surgical site today.  There is trace edema.  There is no erythema or warmth.  She is able to actively and passively move the first MPJ although the range of motion is somewhat restricted.  She states it feels stiff.  No other areas of tenderness to bilateral lower extremities.  No other open lesions or pre-ulcerative lesions.  No pain with calf compression, swelling, warmth, erythema.   Assessment and Plan:  Status post right foot EHL repair, doing well with no complications   -Treatment options discussed including all alternatives, risks, and complications -Overall she has been improving.  [facility].  I want her to remain in the cam boot for now but as she starts physical therapy she can start to transition to regular, supportive sneaker as tolerated.  Discussed not going barefoot.  Ice and elevation.  Vivi Barrack DPM

## 2020-11-24 DIAGNOSIS — F419 Anxiety disorder, unspecified: Secondary | ICD-10-CM | POA: Diagnosis not present

## 2020-11-25 DIAGNOSIS — R531 Weakness: Secondary | ICD-10-CM | POA: Diagnosis not present

## 2020-11-25 DIAGNOSIS — M25674 Stiffness of right foot, not elsewhere classified: Secondary | ICD-10-CM | POA: Diagnosis not present

## 2020-11-25 DIAGNOSIS — M25571 Pain in right ankle and joints of right foot: Secondary | ICD-10-CM | POA: Diagnosis not present

## 2020-11-25 DIAGNOSIS — M25474 Effusion, right foot: Secondary | ICD-10-CM | POA: Diagnosis not present

## 2020-12-09 ENCOUNTER — Other Ambulatory Visit: Payer: Self-pay

## 2020-12-09 ENCOUNTER — Ambulatory Visit (INDEPENDENT_AMBULATORY_CARE_PROVIDER_SITE_OTHER): Payer: 59 | Admitting: Podiatry

## 2020-12-09 DIAGNOSIS — Z9889 Other specified postprocedural states: Secondary | ICD-10-CM

## 2020-12-09 DIAGNOSIS — S96919D Strain of unspecified muscle and tendon at ankle and foot level, unspecified foot, subsequent encounter: Secondary | ICD-10-CM

## 2020-12-09 DIAGNOSIS — Z8759 Personal history of other complications of pregnancy, childbirth and the puerperium: Secondary | ICD-10-CM | POA: Insufficient documentation

## 2020-12-09 DIAGNOSIS — D696 Thrombocytopenia, unspecified: Secondary | ICD-10-CM | POA: Insufficient documentation

## 2020-12-14 NOTE — Progress Notes (Signed)
Subjective: Jodi Adams is a 35 y.o. is seen today in office s/p right foot extensor hallucis longus tendon direct repair preformed on 10/06/2020.  She presents today walking in the cam boot.  She has been doing physical therapy which is been helpful.  She feels that she still has some stiffness in the big toe but it is starting to loosen up.  No recent injury or falls or any changes otherwise. Denies any fevers, chills, nausea, vomiting.  Objective: General: No acute distress, AAOx3  DP/PT pulses palpable 2/4, CRT < 3 sec to all digits.  Protective sensation intact. Motor function intact.  RIGHT foot: Incision is well coapted without any evidence of dehiscence and scar as well formed.  Slight discomfort on palpation on surgical site.  No significant pain with MPJ range of motion the range of motion peers to be improved.  Overall the extensor tendon appears to be intact.  There is no other areas of discomfort. No other open lesions or pre-ulcerative lesions.  No pain with calf compression, swelling, warmth, erythema.   Assessment and Plan:  Status post right foot EHL repair, doing well with no complications   -Treatment options discussed including all alternatives, risks, and complications -She is continuing to progress.  Continue physical therapy.  As she gets better strength and ability she can transition to regular shoe as tolerated discussed gradual transition.  Plan to continue with compression to help with any swelling as well as ice and elevate.  Vivi Barrack DPM

## 2020-12-30 ENCOUNTER — Ambulatory Visit (INDEPENDENT_AMBULATORY_CARE_PROVIDER_SITE_OTHER): Payer: 59 | Admitting: Podiatry

## 2020-12-30 ENCOUNTER — Other Ambulatory Visit: Payer: Self-pay

## 2020-12-30 DIAGNOSIS — S96919D Strain of unspecified muscle and tendon at ankle and foot level, unspecified foot, subsequent encounter: Secondary | ICD-10-CM

## 2020-12-30 DIAGNOSIS — Z9889 Other specified postprocedural states: Secondary | ICD-10-CM

## 2021-01-04 NOTE — Progress Notes (Signed)
Subjective: Jodi Adams is a 35 y.o. is seen today in office s/p right foot extensor hallucis longus tendon direct repair preformed on 10/06/2020.  States that she still in physical therapy and she is been walking in the boot.  Range of motion has improved some but not back to normal yet.  No recent injury or falls.  Minimal swelling.  No other concerns.     Objective: General: No acute distress, AAOx3  DP/PT pulses palpable 2/4, CRT < 3 sec to all digits.  Protective sensation intact. Motor function intact.  RIGHT foot: Incision is well coapted without any evidence of dehiscence and scar as well formed.  There is no significant discomfort to palpation on surgical site.  Able to actively move the MPJ through range of motion without restriction or pain. No other open lesions or pre-ulcerative lesions.  No pain with calf compression, swelling, warmth, erythema.   Assessment and Plan:  Status post right foot EHL repair, doing well with no complications   -Treatment options discussed including all alternatives, risks, and complications -She is continuing to progress her range of motion limited still on her own.  Continue physical therapy.  She can transition to regular shoe as tolerated.  There is any increasing pain or swelling to return to the cam boot.   Return in about 6 weeks (around 02/10/2021).  Vivi Barrack DPM

## 2021-01-11 ENCOUNTER — Encounter: Payer: Self-pay | Admitting: Family Medicine

## 2021-01-11 ENCOUNTER — Telehealth (INDEPENDENT_AMBULATORY_CARE_PROVIDER_SITE_OTHER): Payer: 59 | Admitting: Family Medicine

## 2021-01-11 VITALS — Temp 97.0°F

## 2021-01-11 DIAGNOSIS — R059 Cough, unspecified: Secondary | ICD-10-CM | POA: Diagnosis not present

## 2021-01-11 MED ORDER — BENZONATATE 100 MG PO CAPS: 100.0000 mg | ORAL_CAPSULE | Freq: Three times a day (TID) | ORAL | 0 refills | Status: DC | PRN

## 2021-01-11 NOTE — Patient Instructions (Signed)
  HOME CARE TIPS:  -[facility] COVID19 testing information: [email or web address] OR [phone] Most pharmacies also offer testing and home test kits. If the Covid19 test is positive, please make a prompt follow up visit with your primary care office or with [facility] to discuss treatment options. Treatments for Covid19 are best given early in the course of the illness.   -I sent the medication(s) we discussed to your pharmacy: Meds ordered this encounter  Medications   benzonatate (TESSALON PERLES) 100 MG capsule    Sig: Take 1 capsule (100 mg total) by mouth 3 (three) times daily as needed.    Dispense:  20 capsule    Refill:  0     -can use tylenol or aleve if needed for fevers, aches and pains per instructions  -can use nasal saline a few times per day if you have nasal congestion; sometimes  a short course of Afrin nasal spray for 3 days can help with symptoms as well  -stay hydrated, drink plenty of fluids and eat small healthy meals - avoid dairy  -can take 1000 IU (25mcg) Vit D3 and 100-500 mg of Vit C daily per instructions  -If the Covid test is positive, check out the CDC website for more information on home care, transmission and treatment for COVID19  -follow up with your doctor in 2-3 days unless improving and feeling better  -stay home while sick, except to seek medical care. If you have COVID19, ideally it would be best to stay home for a full 10 days since the onset of symptoms PLUS one day of no fever and feeling better. Wear a good mask that fits snugly (such as N95 or KN95) if around others to reduce the risk of transmission.  It was nice to meet you today, and I really hope you are feeling better soon. I help [facility] out with telemedicine visits on Tuesdays and Thursdays and am available for visits on those days. If you have any concerns or questions following this visit please schedule a follow up visit with your Primary  Care doctor or seek care at a local urgent care clinic to avoid delays in care.    Seek in person care or schedule a follow up video visit promptly if your symptoms worsen, new concerns arise or you are not improving with treatment. Call 911 and/or seek emergency care if your symptoms are severe or life threatening.   

## 2021-01-11 NOTE — Progress Notes (Signed)
Virtual Visit via Video Note  I connected with Jodi Adams  on 01/11/21 at  4:20 PM EDT by a video enabled telemedicine application and verified that I am speaking with the correct person using two identifiers.  Location patient: home, [place] Location provider:work or home office Persons participating in the virtual visit: patient, provider, daughter  I discussed the limitations of evaluation and management by telemedicine and the availability of in person appointments. The patient expressed understanding and agreed to proceed.   HPI:  Acute telemedicine visit for cough and congestion: -Onset:yesterday -boss was sick last week, her kids have been sick  -Symptoms include: cough, sore throat, body aches, subjective fever -Denies:fevers, CP, SOB, NVD, inability to eat/drink/get out of bed -Pertinent past medical history: see below -Pertinent medication allergies:  Allergies  Allergen Reactions   Lactose    Lactose Intolerance (Gi) Diarrhea   Ceclor [Cefaclor] Rash  -COVID-19 vaccine status:vaccinated and boosted once  ROS: See pertinent positives and negatives per HPI.  Past Medical History:  Diagnosis Date   Anxiety    Chronic nausea    Depression    IBS (irritable bowel syndrome)    ulcers   Meningitis 2005   encephalitis   Pelvic pain     Past Surgical History:  Procedure Laterality Date   DIAGNOSTIC LAPAROSCOPY  2012   negative endo and hysteroscopy     Current Outpatient Medications:    acetaminophen (TYLENOL) 325 MG tablet, Take 2 tablets (650 mg total) by mouth every 4 (four) hours as needed (for pain scale < 4)., Disp: 30 tablet, Rfl: 1   benzonatate (TESSALON PERLES) 100 MG capsule, Take 1 capsule (100 mg total) by mouth 3 (three) times daily as needed., Disp: 20 capsule, Rfl: 0   ibuprofen (ADVIL) 600 MG tablet, Take 1 tablet (600 mg total) by mouth every 6 (six) hours., Disp: 30 tablet, Rfl: 0  EXAM:  VITALS per patient if applicable:  GENERAL: alert, oriented,  appears well and in no acute distress  HEENT: atraumatic, conjunttiva clear, no obvious abnormalities on inspection of external nose and ears  NECK: normal movements of the head and neck  LUNGS: on inspection no signs of respiratory distress, breathing rate appears normal, no obvious gross SOB, gasping or wheezing  CV: no obvious cyanosis  MS: moves all visible extremities without noticeable abnormality  PSYCH/NEURO: pleasant and cooperative, no obvious depression or anxiety, speech and thought processing grossly intact  ASSESSMENT AND PLAN:  Discussed the following assessment and plan:  Cough  -we discussed possible serious and likely etiologies, options for evaluation and workup, limitations of telemedicine visit vs in person visit, treatment, treatment risks and precautions. Pt prefers to treat via telemedicine empirically rather than in person at this moment.  Query viral upper respiratory illness, COVID-19 versus other.  Possible influenza.  She opted for Tessalon for cough, COVID testing (discussed various options) and other symptomatic care measures per patient instructions.  Discussed transmission, potential complications and precautions.  Discussed criteria for treatment if COVID and options for follow-up if needed.  Advised to follow-up with a video visit through her primary care office or [facility] if needed. Work/School slipped offered: declined Advised to seek prompt in person care if worsening, new symptoms arise, or if is not improving with treatment. Discussed options for inperson care if PCP office not available. Did let this patient know that I only do telemedicine on Tuesdays and Thursdays for [facility]. Advised to schedule follow up visit with PCP or [facility] if  any further questions or concerns to avoid delays in care.   I discussed the assessment and treatment plan with the patient. The patient was provided an opportunity to ask questions and all were answered. The patient  agreed with the plan and demonstrated an understanding of the instructions.     Terressa Koyanagi, DO

## 2021-02-10 ENCOUNTER — Encounter: Payer: 59 | Admitting: Podiatry

## 2021-02-17 ENCOUNTER — Other Ambulatory Visit: Payer: Self-pay

## 2021-02-17 ENCOUNTER — Ambulatory Visit (INDEPENDENT_AMBULATORY_CARE_PROVIDER_SITE_OTHER): Payer: 59 | Admitting: Podiatry

## 2021-02-17 DIAGNOSIS — S96919D Strain of unspecified muscle and tendon at ankle and foot level, unspecified foot, subsequent encounter: Secondary | ICD-10-CM

## 2021-02-22 NOTE — Progress Notes (Signed)
Subjective: Jodi Adams is a 34 y.o. is seen today in office s/p right foot extensor hallucis longus tendon direct repair preformed on 10/06/2020.  She states that she is still doing well and continue physical therapy.  She is back to wearing a regular shoe and she has increased her activity and she is back to walk about 45 minutes.  She feels that her strength still there but has made improvements.  No recent falls or changes otherwise.   Objective: General: No acute distress, AAOx3  DP/PT pulses palpable 2/4, CRT < 3 sec to all digits.  Protective sensation intact. Motor function intact.  RIGHT foot: Incision is well coapted without any evidence of dehiscence and scar as well formed.  Strength appears to be improved.  MMT 5/5.  No significant discomfort on the surgical site.  Tendon appears to be intact. No other open lesions or pre-ulcerative lesions.  No pain with calf compression, swelling, warmth, erythema.   Assessment and Plan:  Status post right foot EHL repair, doing well with no complications   -Treatment options discussed including all alternatives, risks, and complications -She is still improving she is back to walking about 45 minutes.  Continue physical therapy discussed gradual increase activity level.  When she can get back to normal walking then she can start incorporating slow jog.  Continue ice elevate particular after activity or during the day to help with any postoperative edema.  Return in about 6 weeks (around 03/31/2021).  Vivi Barrack DPM

## 2021-03-31 ENCOUNTER — Ambulatory Visit (INDEPENDENT_AMBULATORY_CARE_PROVIDER_SITE_OTHER): Payer: 59 | Admitting: Podiatry

## 2021-03-31 ENCOUNTER — Other Ambulatory Visit: Payer: Self-pay

## 2021-03-31 DIAGNOSIS — S96919D Strain of unspecified muscle and tendon at ankle and foot level, unspecified foot, subsequent encounter: Secondary | ICD-10-CM

## 2021-04-06 NOTE — Progress Notes (Signed)
Subjective: Jodi Adams is a 35 y.o. is seen today in office s/p right foot extensor hallucis longus tendon direct repair preformed on 10/06/2020.  States that she is back to wearing regular shoes and she has been increasing her walking as well as the distance.  She has not yet gone back to doing activities such as pickleball as she is nervous about sudden movements.  Does not have any significant pain.  She has no other concerns.  Objective: General: No acute distress, AAOx3  DP/PT pulses palpable 2/4, CRT < 3 sec to all digits.  Protective sensation intact. Motor function intact.  RIGHT foot: Incision is well coapted without any evidence of dehiscence and scar as well formed.  Strength of the EHL is much improved and appears to be close to normal.  MMT 5/5.  No significant discomfort on the surgical site.  Tendon appears to be intact. No other open lesions or pre-ulcerative lesions.  No pain with calf compression, swelling, warmth, erythema.   Assessment and Plan:  Status post right foot EHL repair, doing well with no complications   -Treatment options discussed including all alternatives, risks, and complications -At this point discussed with her gradual increase activity level still.  She is nervous about doing activities such as pickleball which she wants to get into doing again.  Discussed that she can use the ankle brace if needed for the first little while until she becomes more comfortable with this.  Continue with supportive shoe gear.  Should there be any increase in pain or swelling or any other issues let me know.  Otherwise I will see her back as needed.  Vivi Barrack DPM

## 2021-10-07 IMAGING — MR MR FOOT*R* W/O CM
4 of 5 series · 25 of 40 positions shown · non-contrast
Comparison: Radiographs 09/07/2020

CLINICAL DATA: Injured foot. Laceration on the dorsum of the foot.
Pain in the great toe.

EXAM:
MRI OF THE RIGHT FOREFOOT WITHOUT CONTRAST
TECHNIQUE: Multiplanar, multisequence MR imaging of the right foot was
performed. No intravenous contrast was administered.

[Series 3: T1 · coronal · 3.0mm · 0.23mm/px · 6 of 50 slices shown (1 of 2)]
[im 1/50]
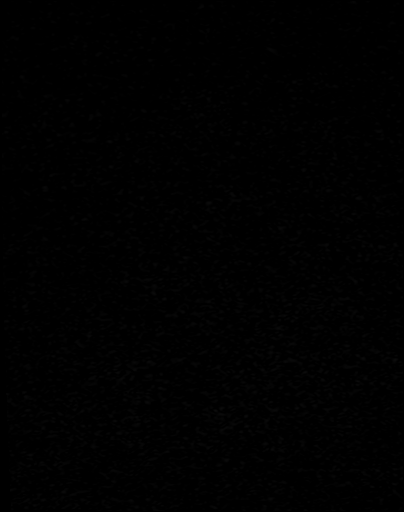
[im 9/50]
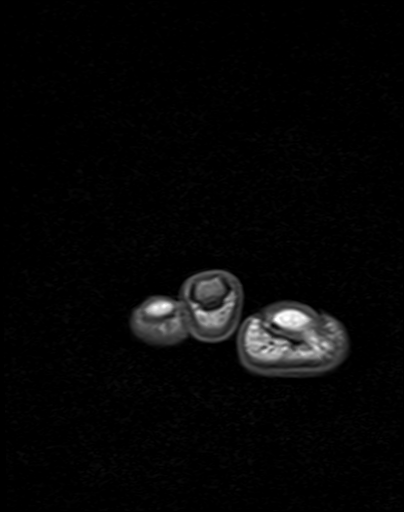
[im 14/50]
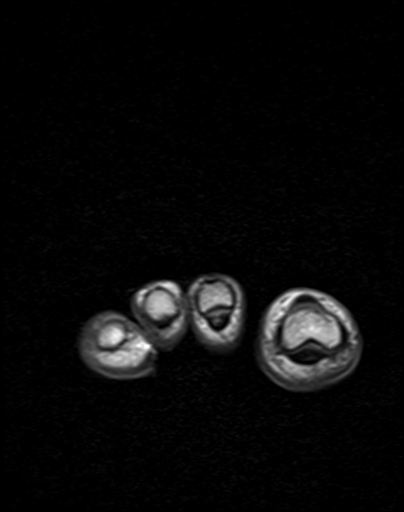
[im 23/50]
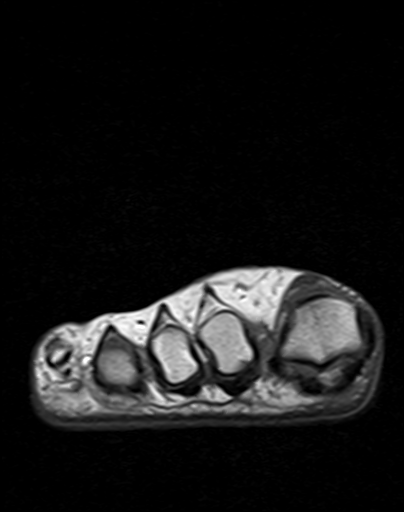
[im 27/50]
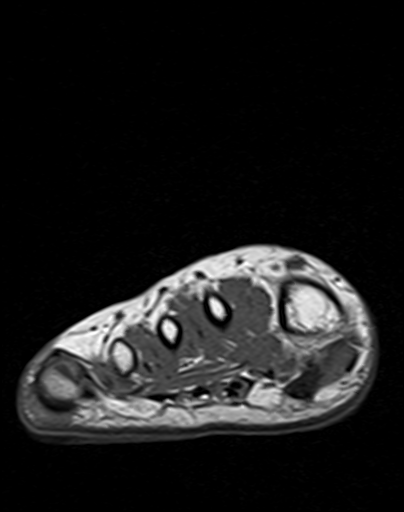
[im 45/50]
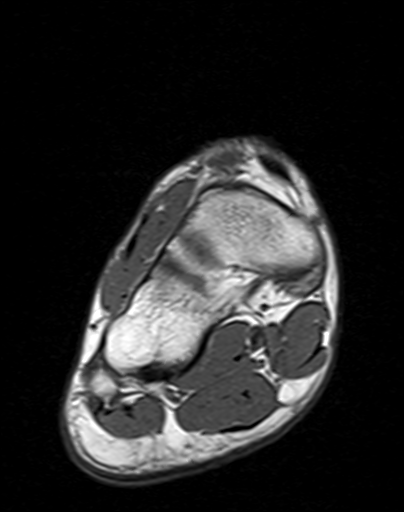

[Series 4: T2 fat-sat · coronal · 3.0mm · 0.23mm/px · 11 of 49 slices shown (1 of 2)]
[im 1/49]
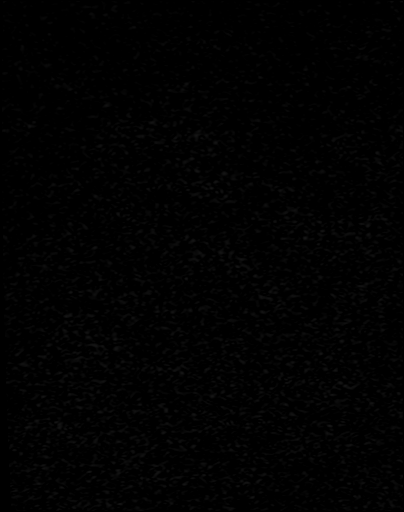
[im 5/49]
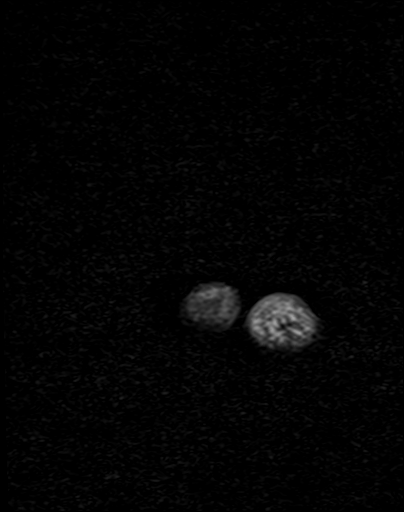
[im 10/49]
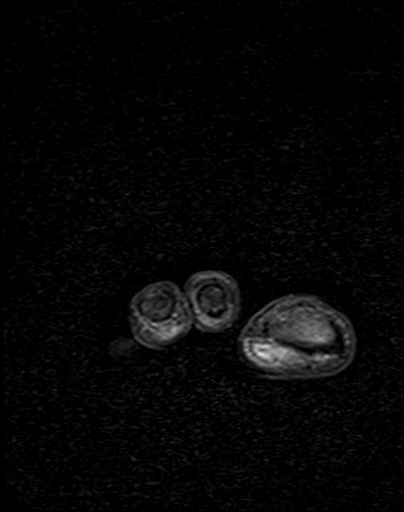
[im 15/49]
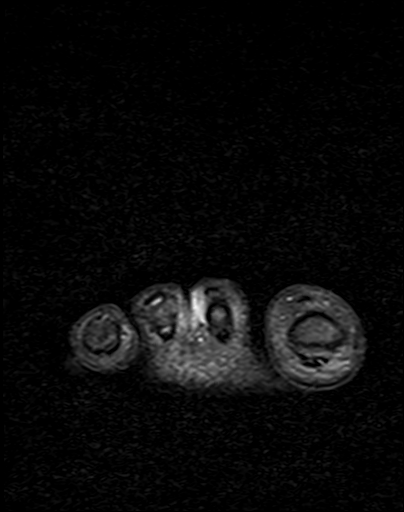
[im 20/49]
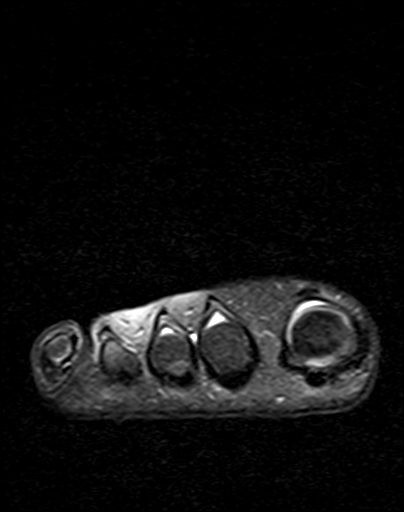
[im 25/49]
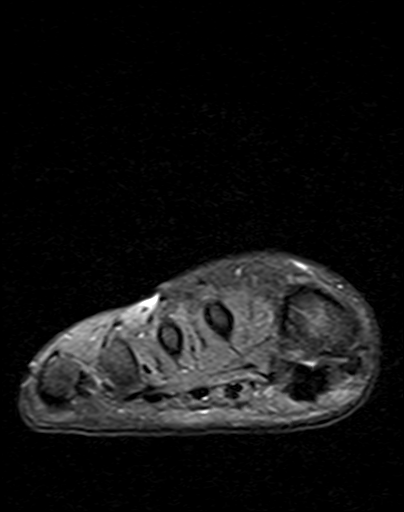
[im 29/49]
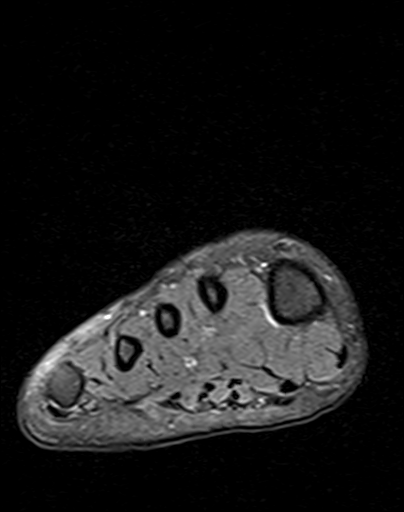
[im 34/49]
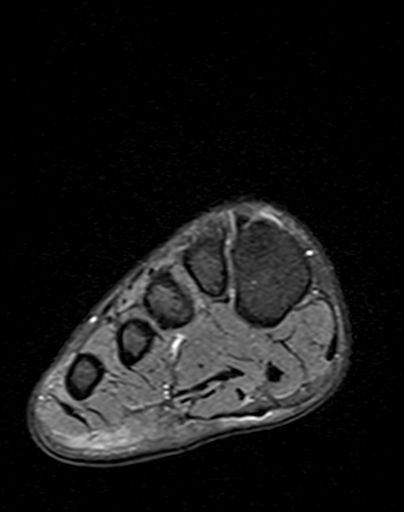
[im 39/49]
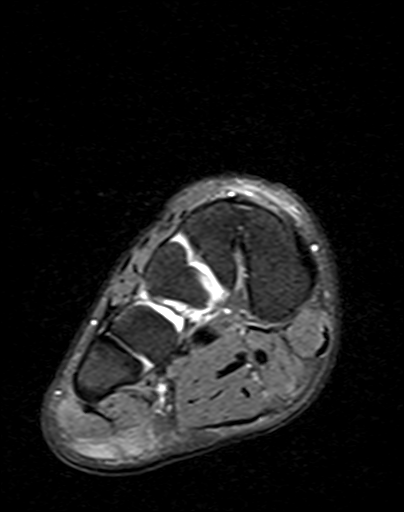
[im 44/49]
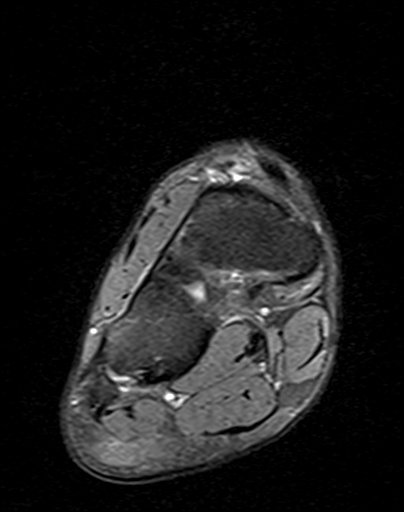
[im 49/49]
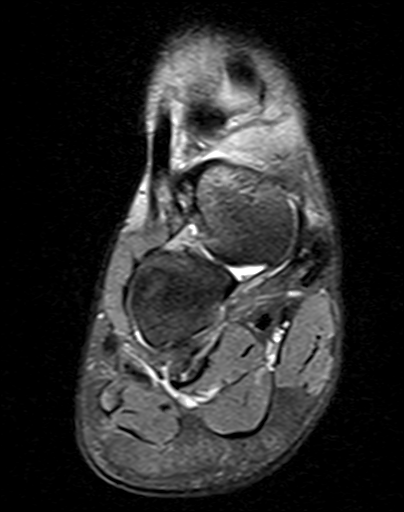

[Series 5: T2 fat-sat · axial · 3.0mm · 0.35mm/px · z∈[-63,+15]mm · 5 of 22 slices shown (2 of 2)]
[im 1/22]
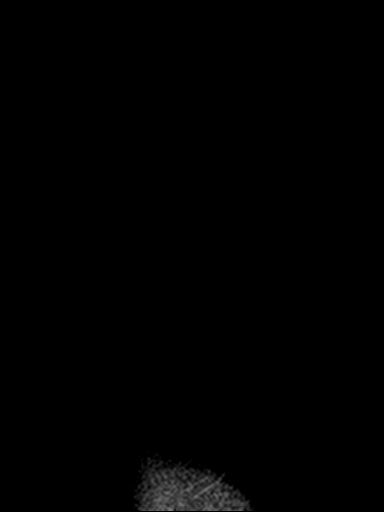
[im 6/22]
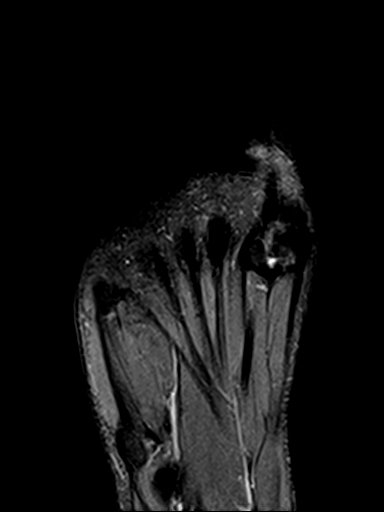
[im 11/22]
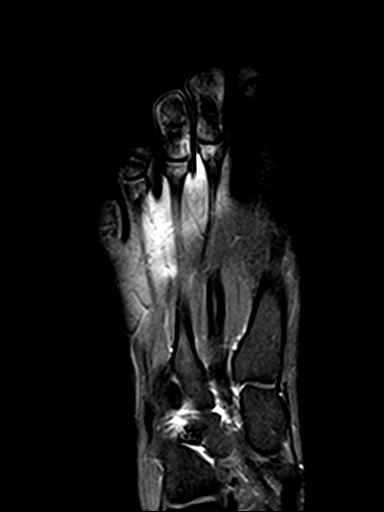
[im 16/22]
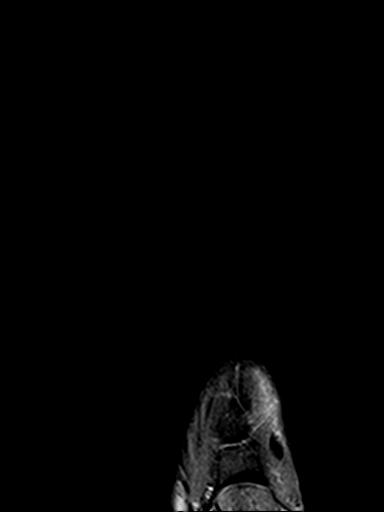
[im 22/22]
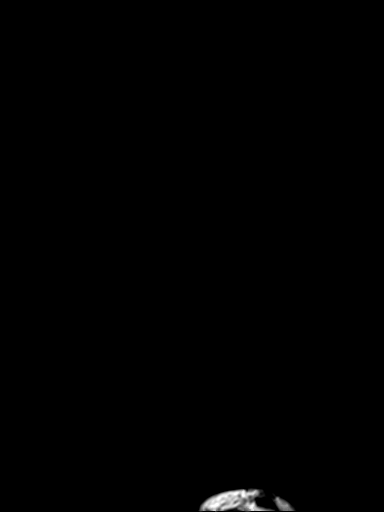

[Series 6: T1 · axial · 3.0mm · 0.35mm/px · z∈[-63,+15]mm · 3 of 22 slices shown (2 of 2)]
[im 1/22]
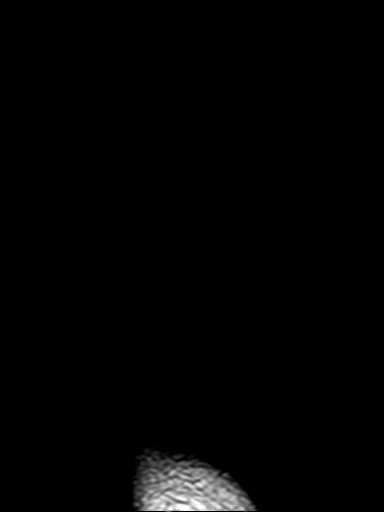
[im 11/22]
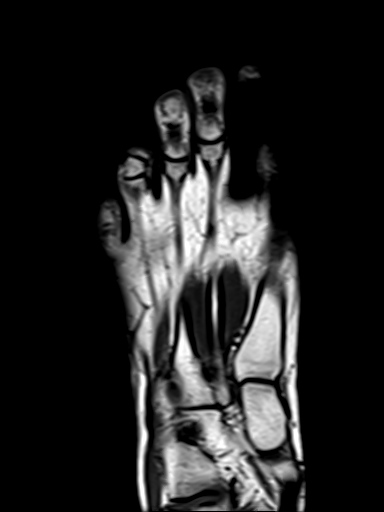
[im 22/22]
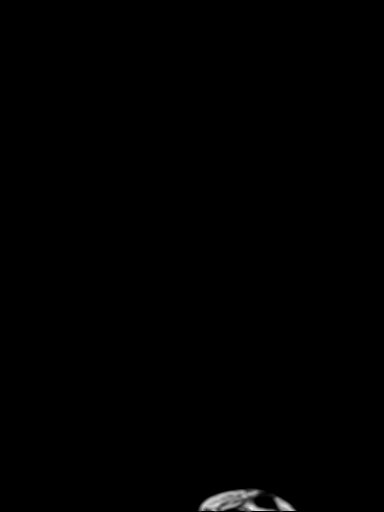

[25 of 40 positions shown; findings below may reference images not displayed]

FINDINGS: There is a soft tissue injury noted along the dorsum of the forefoot
likely the patient's laceration. Associated injury of the extensor
hallucis longus tendon which is lacerated and markedly retracted.
The tendon is thickened and wavy proximal to the laceration. The
distal tendon is also wavy and slightly redundant. It appears that
the laceration is at the level of the navicular articulation with
the middle cuneiform. There is associated focal fluid in this area.
The anterior tibialis tendon is intact and the extensor digitorum
longus tendon is intact.

No evidence of acute bony injury. The joint spaces are maintained.
The foot musculature is unremarkable.
IMPRESSION: 1. Complete laceration of the extensor hallucis longus tendon.
2. The anterior tibialis and extensor digitorum longus tendons are
intact.
3. No evidence for acute bony injury.

## 2022-09-01 ENCOUNTER — Encounter: Payer: Self-pay | Admitting: Family Medicine

## 2022-09-01 DIAGNOSIS — M2550 Pain in unspecified joint: Secondary | ICD-10-CM

## 2022-09-29 ENCOUNTER — Ambulatory Visit (INDEPENDENT_AMBULATORY_CARE_PROVIDER_SITE_OTHER): Payer: 59 | Admitting: Adult Health

## 2022-09-29 ENCOUNTER — Encounter: Payer: Self-pay | Admitting: Adult Health

## 2022-09-29 VITALS — BP 116/74 | HR 78 | Ht 61.0 in | Wt 134.0 lb

## 2022-09-29 DIAGNOSIS — Z85828 Personal history of other malignant neoplasm of skin: Secondary | ICD-10-CM

## 2022-09-29 DIAGNOSIS — F900 Attention-deficit hyperactivity disorder, predominantly inattentive type: Secondary | ICD-10-CM | POA: Diagnosis not present

## 2022-09-29 DIAGNOSIS — F411 Generalized anxiety disorder: Secondary | ICD-10-CM

## 2022-09-29 HISTORY — PX: MOHS SURGERY: SUR867

## 2022-09-29 HISTORY — DX: Personal history of other malignant neoplasm of skin: Z85.828

## 2022-09-29 MED ORDER — FLUOXETINE HCL 10 MG PO CAPS: 10.0000 mg | ORAL_CAPSULE | Freq: Every day | ORAL | 2 refills | Status: DC

## 2022-09-29 MED ORDER — AMPHETAMINE-DEXTROAMPHETAMINE 20 MG PO TABS: 20.0000 mg | ORAL_TABLET | Freq: Every day | ORAL | 0 refills | Status: DC

## 2022-09-29 NOTE — Progress Notes (Signed)
[facility] MD/PA/NP Initial Note  09/29/2022 10:49 AM Jodi Adams  MRN:  [id number]  Chief Complaint:   HPI:   Describes mood today as "ok". Pleasant. Reports tearfulness. Mood symptoms - reports depression, anxiety, and irritability. Reports worry, rumination, and over thinking. Reports obsessive thoughts and acts. Mood is up and down.   Has been trialed on a few SSRI's without success - feels like she did not give them time to help. Stating "I'm willing to give it another try". Also reporting increased difficulties with focus and concentration. Reports symptoms presented in childhood, but was able to manage. She is now married with 2 children and feels like she is overwhelmed at home and work. Reports difficulties meeting daily obligations - feels "scattered". Stating "I feel like I'm all over the place". Willing to consider options to manage symptoms. Recently started seeing therapist - Delma Officer. Stable interest and motivation. Taking medications as prescribed.  Energy levels lower - feels fatigued all the time. Active, does not have a regular exercise routine. Walking. Enjoys some usual interests and activities. Married. Lives with husband of 11 years. Has 34 children 71 year old daughter and  68 year old son. Parents live in [place]. Spending time with friends.  Appetite adequate. Weight loss 134 pounds. Sleeping difficulties. Averages 4 to 5 hours. Hasn't slept well since she had her child 6 years ago.  Focus and concentration difficulties - since childhood. Gets paralyzed with tasks. Executive functioning difficulties. Mother, father, and brother all ADD.Completing tasks. Managing aspects of household. 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 Denies SI or HI.  Denies AH or VH. Denies self harm. Denies substance use. Therapist - Delma Officer.  Previous medication trials:  Zoloft, Lexapro,   Visit Diagnosis:    ICD-10-CM   1. Generalized anxiety disorder   F41.1     2. Attention deficit hyperactivity disorder (ADHD), predominantly inattentive type  F90.0       Past Psychiatric History: Denies psychiatric hospitalization.   Past Medical History:  Past Medical History:  Diagnosis Date   Anxiety    Chronic nausea    Depression    IBS (irritable bowel syndrome)    ulcers   Meningitis 2005   encephalitis   Pelvic pain     Past Surgical History:  Procedure Laterality Date   DIAGNOSTIC LAPAROSCOPY  2012   negative endo and hysteroscopy    Family Psychiatric History: Family history of mental illness - ADD and Bipolar.  Family History:  Family History  Problem Relation Age of Onset   Hypertension Father    Heart disease Father    Thyroid disease Father    Hypertension Brother    Breast cancer Maternal Grandmother    Depression Maternal Grandmother        manic   Diabetes Maternal Grandfather    Hypertension Maternal Grandfather    Heart disease Maternal Grandfather    Breast cancer Paternal Grandmother    Bipolar disorder Mother     Social History:  Social History   Socioeconomic History   Marital status: Married    Spouse name: Not on file   Number of children: Not on file   Years of education: Not on file   Highest education level: Not on file  Occupational History   Not on file  Tobacco Use   Smoking status: Never   Smokeless tobacco: Never  Substance and Sexual Activity   Alcohol use: Not Currently   Drug use: Never  Sexual activity: Not on file  Other Topics Concern   Not on file  Social History Narrative   Not on file   Social Determinants of Health   Financial Resource Strain: Not on file  Food Insecurity: Not on file  Transportation Needs: Not on file  Physical Activity: Not on file  Stress: Not on file  Social Connections: Not on file    Allergies:  Allergies  Allergen Reactions   Lactose    Lactose Intolerance (Gi) Diarrhea   Ceclor [Cefaclor] Rash    Metabolic Disorder Labs: No  results found for: "HGBA1C", "MPG" No results found for: "PROLACTIN" No results found for: "CHOL", "TRIG", "HDL", "CHOLHDL", "VLDL", "LDLCALC" Lab Results  Component Value Date   TSH 2.01 12/02/2019   TSH 1.218 08/26/2013    Therapeutic Level Labs: No results found for: "LITHIUM" No results found for: "VALPROATE" No results found for: "CBMZ"  Current Medications: Current Outpatient Medications  Medication Sig Dispense Refill   acetaminophen (TYLENOL) 325 MG tablet Take 2 tablets (650 mg total) by mouth every 4 (four) hours as needed (for pain scale < 4). 30 tablet 1   benzonatate (TESSALON PERLES) 100 MG capsule Take 1 capsule (100 mg total) by mouth 3 (three) times daily as needed. 20 capsule 0   ibuprofen (ADVIL) 600 MG tablet Take 1 tablet (600 mg total) by mouth every 6 (six) hours. 30 tablet 0   No current facility-administered medications for this visit.    Medication Side Effects: none  Orders placed this visit:  No orders of the defined types were placed in this encounter.   Psychiatric Specialty Exam:  Review of Systems  Musculoskeletal:  Negative for gait problem.  Neurological:  Negative for tremors.  Psychiatric/Behavioral:         Please refer to HPI    Blood pressure 116/74, pulse 78, height '5\' 1"'$  (1.549 m), weight 134 lb (60.8 kg).Body mass index is 25.32 kg/m.  General Appearance: Casual and Neat  Eye Contact:  Good  Speech:  Clear and Coherent and Normal Rate  Volume:  Normal  Mood:  Anxious  Affect:  Appropriate and Congruent  Thought Process:  Coherent and Descriptions of Associations: Intact  Orientation:  Full (Time, Place, and Person)  Thought Content: Logical   Suicidal Thoughts:  No  Homicidal Thoughts:  No  Memory:  WNL  Judgement:  Good  Insight:  Good  Psychomotor Activity:  Normal  Concentration:  Concentration: Good and Attention Span: Good  Recall:  Good  Fund of Knowledge: Good  Language: Good  Assets:  Communication  Skills Desire for Improvement Financial Resources/Insurance Housing Intimacy Leisure Time Physical Health Resilience Social Support Talents/Skills Transportation Vocational/Educational  ADL's:  Intact  Cognition: WNL  Prognosis:  Good   Screenings: MDQ  Receiving Psychotherapy: Yes   Treatment Plan/Recommendations:   Plan:  PDMP reviewed  Add Prozac '10mg'$  daily Add Adderall '20mg'$  - 1/2 tablet twice daily  Patient seen for 30 minutes and time spent discussing treatment options.  RTC 4 weeks  Patient advised to contact office with any questions, adverse effects, or acute worsening in signs and symptoms.   Discussed potential benefits, risks, and side effects of stimulants with patient to include increased heart rate, palpitations, insomnia, increased anxiety, increased irritability, or decreased appetite.  Instructed patient to contact office if experiencing any significant tolerability issues.    Aloha Gell, NP

## 2022-10-22 ENCOUNTER — Other Ambulatory Visit: Payer: Self-pay | Admitting: Adult Health

## 2022-10-22 DIAGNOSIS — F411 Generalized anxiety disorder: Secondary | ICD-10-CM

## 2022-10-26 ENCOUNTER — Ambulatory Visit: Payer: 59 | Admitting: Adult Health

## 2022-11-10 ENCOUNTER — Ambulatory Visit (INDEPENDENT_AMBULATORY_CARE_PROVIDER_SITE_OTHER): Payer: 59 | Admitting: Adult Health

## 2022-11-10 ENCOUNTER — Encounter: Payer: Self-pay | Admitting: Adult Health

## 2022-11-10 DIAGNOSIS — F411 Generalized anxiety disorder: Secondary | ICD-10-CM | POA: Diagnosis not present

## 2022-11-10 DIAGNOSIS — F900 Attention-deficit hyperactivity disorder, predominantly inattentive type: Secondary | ICD-10-CM

## 2022-11-10 MED ORDER — FLUOXETINE HCL 20 MG PO CAPS: 20.0000 mg | ORAL_CAPSULE | Freq: Every day | ORAL | 2 refills | Status: DC

## 2022-11-10 MED ORDER — LISDEXAMFETAMINE DIMESYLATE 20 MG PO CAPS: 20.0000 mg | ORAL_CAPSULE | Freq: Every day | ORAL | 0 refills | Status: DC

## 2022-11-10 NOTE — Progress Notes (Signed)
Jodi Adams [id number] 12-02-1985 37 y.o.  Subjective:   Patient ID:  Jodi Adams is a 37 y.o. (DOB [birth year]) female.  Chief Complaint: No chief complaint on file.   HPI Jodi Adams presents to the office today for follow-up of GAD and ADHD.  Describes mood today as "ok". Pleasant. Reports tearfulness. Mood symptoms - reports depression, anxiety, and irritability. Reports worry, rumination, and over thinking. Reports obsessive thoughts and acts. Mood is up and down. Stating "I don't feel any better". Reports she has tolerated the addition of Prozac and would like to try increasing the dose. Did not tolerate the Adderall, but willing to consider other options. Stable interest and motivation. Taking medications as prescribed.   Recently started seeing therapist - Jodi Adams. Energy levels lower. Active, does not have a regular exercise routine. Walking. Enjoys some usual interests and activities. Married. Lives with husband of 11 years. Has 28 children 39 year old daughter and  21 year old son. Parents live in [place]. Spending time with friends.  Appetite adequate. Weight loss 134 pounds. Reports sleeping difficulties. Averages 4 hours.  Focus and concentration difficulties - since childhood. Gets "overwhelmed" with tasks. Mother, father, and brother all ADD. Completing tasks. Managing aspects of household. 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 Denies SI or HI.  Denies AH or VH. Denies self harm. Denies substance use.  Therapist - Jodi Adams.  Previous medication trials:  Zoloft, Lexapro,     Review of Systems:  Review of Systems  Medications: I have reviewed the patient's current medications.  Current Outpatient Medications  Medication Sig Dispense Refill   acetaminophen (TYLENOL) 325 MG tablet Take 2 tablets (650 mg total) by mouth every 4 (four) hours as needed (for pain scale < 4). 30 tablet 1   amphetamine-dextroamphetamine (ADDERALL) 20 MG  tablet Take 1 tablet (20 mg total) by mouth daily. 30 tablet 0   benzonatate (TESSALON PERLES) 100 MG capsule Take 1 capsule (100 mg total) by mouth 3 (three) times daily as needed. 20 capsule 0   FLUoxetine (PROZAC) 10 MG capsule Take 1 capsule (10 mg total) by mouth daily. 30 capsule 2   ibuprofen (ADVIL) 600 MG tablet Take 1 tablet (600 mg total) by mouth every 6 (six) hours. 30 tablet 0   No current facility-administered medications for this visit.    Medication Side Effects: None  Allergies:  Allergies  Allergen Reactions   Lactose    Lactose Intolerance (Gi) Diarrhea   Ceclor [Cefaclor] Rash    Past Medical History:  Diagnosis Date   Anxiety    Chronic nausea    Depression    IBS (irritable bowel syndrome)    ulcers   Meningitis 2005   encephalitis   Pelvic pain     Past Medical History, Surgical history, Social history, and Family history were reviewed and updated as appropriate.   Please see review of systems for further details on the patient's review from today.   Objective:   Physical Exam:  There were no vitals taken for this visit.  Physical Exam  Lab Review:     Component Value Date/Time   NA 138 12/02/2019 1054   K 4.2 12/02/2019 1054   CL 103 12/02/2019 1054   CO2 30 12/02/2019 1054   GLUCOSE 83 12/02/2019 1054   BUN 16 12/02/2019 1054   CREATININE 0.81 12/02/2019 1054   CALCIUM 10.0 12/02/2019 1054       Component Value Date/Time   WBC  5.0 12/02/2019 1054   RBC 5.14 (H) 12/02/2019 1054   HGB 14.1 12/02/2019 1054   HCT 42.9 12/02/2019 1054   PLT 177.0 12/02/2019 1054   MCV 83.4 12/02/2019 1054   MCH 24.4 (L) 09/16/2019 0633   MCHC 32.8 12/02/2019 1054   RDW 20.6 (H) 12/02/2019 1054   LYMPHSABS 1.9 12/02/2019 1054   MONOABS 0.4 12/02/2019 1054   EOSABS 0.2 12/02/2019 1054   BASOSABS 0.0 12/02/2019 1054    No results found for: "POCLITH", "LITHIUM"   No results found for: "PHENYTOIN", "PHENOBARB", "VALPROATE", "CBMZ"    .res Assessment: Plan:    Plan:  PDMP reviewed  Prozac  to  daily  Add Vyvanse   D/C Adderall  - 1/2 tablet twice daily  Patient seen for 30 minutes and time spent discussing treatment options.  RTC 4 weeks  Patient advised to contact office with any questions, adverse effects, or acute worsening in signs and symptoms.   Discussed potential benefits, risks, and side effects of stimulants with patient to include increased heart rate, palpitations, insomnia, increased anxiety, increased irritability, or decreased appetite.  Instructed patient to contact office if experiencing any significant tolerability issues.  There are no diagnoses linked to this encounter.   Please see After Visit Summary for patient specific instructions.  Future Appointments  Date Time Provider Department Center  11/10/2022 11:20 AM Jodi Adams, Jodi Solo, NP [facility] None  11/23/2022  1:40 PM Jodi Adams, Jodi Orleans, MD [facility] None    No orders of the defined types were placed in this encounter.   -------------------------------

## 2022-11-23 ENCOUNTER — Encounter: Payer: Self-pay | Admitting: Internal Medicine

## 2022-11-23 ENCOUNTER — Ambulatory Visit: Payer: 59 | Attending: Internal Medicine | Admitting: Internal Medicine

## 2022-11-23 VITALS — BP 99/67 | HR 91 | Resp 14 | Ht 61.75 in | Wt 130.0 lb

## 2022-11-23 DIAGNOSIS — N3010 Interstitial cystitis (chronic) without hematuria: Secondary | ICD-10-CM

## 2022-11-23 DIAGNOSIS — M79642 Pain in left hand: Secondary | ICD-10-CM

## 2022-11-23 DIAGNOSIS — R768 Other specified abnormal immunological findings in serum: Secondary | ICD-10-CM

## 2022-11-23 DIAGNOSIS — R899 Unspecified abnormal finding in specimens from other organs, systems and tissues: Secondary | ICD-10-CM | POA: Diagnosis not present

## 2022-11-23 DIAGNOSIS — M79641 Pain in right hand: Secondary | ICD-10-CM | POA: Diagnosis not present

## 2022-11-23 DIAGNOSIS — K589 Irritable bowel syndrome without diarrhea: Secondary | ICD-10-CM

## 2022-11-23 DIAGNOSIS — G4709 Other insomnia: Secondary | ICD-10-CM

## 2022-11-23 NOTE — Progress Notes (Signed)
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 Patient states she has pain in her neck, hips, and hands.)   History of Present Illness: Jodi Adams is a 37 y.o. female here for evaluation of joint pain in multiple areas and fatigue with positive ANA.  She recalls some of these ongoing symptoms dating back to about 2012 where she was living in [place] at the time.  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  She has joint pain issues mostly affecting her hands hips and neck.  She saw a rheumatologist living in [place] apparently had abnormal labs with sound like positive ANA dating back to that time but no specific diagnosis was made.  She also had extensive evaluation with subspecialist including gastroenterology for irritable bowel symptoms predominant diarrhea that have been ongoing since adolescence.  Never identified specific inflammatory condition and tried multiple diets without resolution.  Had extensive evaluation for urinary frequency and irritability had urologic workup including diagnostic tests and repeat instillation treatments for interstitial cystitis.  Since around that time also had significant insomnia pretty much constantly. Subsequent years she largely de prioritized trying to resolve some of the symptoms after she became pregnant and taking care of of her young children.  Her first pregnancy was complicated by severe hyperemesis gravidarum with excessive weight loss and also with excessive blood loss during  delivery. She had her second child in 2021 with less complications and after that had some additional workup checked for these symptoms. However now she is trying to follow up further due to ongoing symptoms.  She describes bilateral hand pain that is pretty much continuously present associated with difficulty gripping tightly and often gets a tingling or shock sensation in her hands.  Not associated with much visible swelling or discoloration.  Neck pain is worst at the base of the skull gets significant pain with movement including lateral rotation to both sides is very bothersome when driving she cannot rotate her head at a normal speed without provoking pain.  Does not have much tenderness to pressure over the muscles in her neck or upper back.  Bilateral hip pain also provoked more with movement than with pressure though does not notice any weakness or decrease in range of motion.  She has tried various treatments including holistic medicine and physical therapy without significant symptom improvement so mostly just tolerate symptoms and does not take extra medications for this currently.  Fatigue bothers her daily but does not have excessive daytime sleepiness and no napping.  She reports delayed sleep onset and frequent awakenings usually only consistently sleeping between the hours of 3 and 5 AM.  She is avoided trying potent sedative medications for sleep due to concern about waking up for her children.  Recently has started on Prozac with psychiatry and may be seen partial improvement in insomnia.  During daytime she has difficulty with concentration and short-term recall.  Was also  diagnosed with ADD and on treatment for this.  Does not have severe headache symptoms.  She experiences dysequilibrium and lightheadedness with standing quickly. She has sometimes passed out associated with this and apparently had cardiology evaluation that was benign. No raynaud's symptoms, no pedal edema, no history of  blood clots.  She notices swelling around the jaw and neck without specific palpable nodules or tenderness. Has dry eyes and dry mouth issues. She was told she cannot wear contacts due to this and was prescribed some drops but does not use them.  Labs reviewed 12/02/19 ANA 1:80 homogenous RF neg CCP neg ESR 21 CRP neg B. Burgdorferi neg  08/2020 MRI Right Foot IMPRESSION: 1. Complete laceration of the extensor hallucis longus tendon. 2. The anterior tibialis and extensor digitorum longus tendons are intact. 3. No evidence for acute bony injury.  Activities of Daily Living:  Patient reports morning stiffness for 24 hours.   Patient Reports nocturnal pain.  Difficulty dressing/grooming: Denies Difficulty climbing stairs: Reports Difficulty getting out of chair: Reports Difficulty using hands for taps, buttons, cutlery, and/or writing: Reports  Review of Systems  Constitutional:  Positive for fatigue.  HENT:  Positive for mouth sores and mouth dryness.   Eyes:  Positive for dryness.  Respiratory:  Negative for shortness of breath.   Cardiovascular:  Negative for chest pain and palpitations.  Gastrointestinal:  Positive for diarrhea. Negative for blood in stool and constipation.  Endocrine: Positive for increased urination.  Genitourinary:  Negative for involuntary urination.  Musculoskeletal:  Positive for joint pain, joint pain, joint swelling, myalgias, muscle weakness, morning stiffness, muscle tenderness and myalgias. Negative for gait problem.  Skin:  Positive for hair loss and sensitivity to sunlight. Negative for color change and rash.  Allergic/Immunologic: Negative for susceptible to infections.  Neurological:  Positive for dizziness and headaches.  Hematological:  Positive for swollen glands.  Psychiatric/Behavioral:  Positive for depressed mood and sleep disturbance. The patient is nervous/anxious.     PMFS History:  Patient Active Problem List   Diagnosis Date  Noted   Positive ANA (antinuclear antibody) 11/23/2022   Abnormal laboratory test result 11/23/2022   Other insomnia 11/23/2022   Bilateral hand pain 11/23/2022   History of postpartum hemorrhage 12/09/2020   Thrombocytopenic disorder (HCC) 12/09/2020   [redacted] weeks gestation of pregnancy 09/15/2019   IBS (irritable bowel syndrome) 01/27/2019   Third degree perineal laceration 09/06/2018   Vitamin D deficiency 09/06/2018   Anxiety 08/27/2018   Chronic interstitial cystitis 08/27/2018   Depressive disorder 08/27/2018   Impaired glucose tolerance 08/27/2018   Dysmenorrhea 12/26/2012   Other specified symptom associated with female genital organs 12/26/2012   Menorrhagia 12/26/2012    Past Medical History:  Diagnosis Date   ADD (attention deficit disorder)    Adenomyosis    Anxiety    Chronic nausea    Depression    IBS (irritable bowel syndrome)    ulcers   Interstitial cystitis    Meningitis 2005   encephalitis   Pelvic pain     Family History  Problem Relation Age of Onset   Bipolar disorder Mother    COPD Mother    Other Mother        adenomyosis   Hypertension Father    Heart disease Father    Thyroid disease Father    Kidney Stones Father    Rheum arthritis Father    Hypertension Brother    Breast cancer Maternal Grandmother    Depression Maternal Grandmother  manic   Diabetes Maternal Grandfather    Hypertension Maternal Grandfather    Heart disease Maternal Grandfather    Breast cancer Paternal Grandmother    Past Surgical History:  Procedure Laterality Date   DIAGNOSTIC LAPAROSCOPY  07/31/2010   negative endo and hysteroscopy   FOOT TENDON SURGERY Right 08/2020   MOHS SURGERY  09/2022   forehead   Social History   Social History Narrative   Not on file   Immunization History  Administered Date(s) Administered   Influenza,inj,Quad PF,6+ Mos 05/23/2019   Moderna Sars-Covid-2 Vaccination 10/21/2019, 11/18/2019, 08/04/2020   Tdap 06/20/2019      Objective: Vital Signs: BP 99/67 (BP Location: Right Arm, Patient Position: Sitting, Cuff Size: Normal)   Pulse 91   Resp 14   Ht 5' 1.75" (1.568 m)   Wt 130 lb (59 kg)   LMP 11/02/2022   BMI 23.97 kg/m    Physical Exam HENT:     Right Ear: External ear normal.     Left Ear: External ear normal.     Mouth/Throat:     Mouth: Mucous membranes are moist.     Pharynx: Oropharynx is clear.  Eyes:     Conjunctiva/sclera: Conjunctivae normal.  Cardiovascular:     Rate and Rhythm: Normal rate and regular rhythm.  Pulmonary:     Effort: Pulmonary effort is normal.     Breath sounds: Normal breath sounds.  Musculoskeletal:     Right lower leg: No edema.     Left lower leg: No edema.  Lymphadenopathy:     Cervical: No cervical adenopathy.  Skin:    Findings: No rash.  Neurological:     General: No focal deficit present.     Mental Status: She is alert.     Motor: No weakness.     Deep Tendon Reflexes: Reflexes normal.  Psychiatric:        Mood and Affect: Mood normal.      Musculoskeletal Exam:  Neck full ROM no tenderness Shoulders full ROM no tenderness or swelling Elbows full ROM no tenderness or swelling Wrists full ROM no tenderness or swelling Fingers full ROM no tenderness or swelling No paraspinal tenderness to palpation over upper and lower back Hip normal internal and external rotation without pain, no tenderness to lateral hip palpation Knees full ROM no tenderness or swelling Ankles full ROM no tenderness or swelling   Investigation: No additional findings.  Imaging: No results found.  Recent Labs: Lab Results  Component Value Date   WBC 5.0 12/02/2019   HGB 14.1 12/02/2019   PLT 177.0 12/02/2019   NA 138 12/02/2019   K 4.2 12/02/2019   CL 103 12/02/2019   CO2 30 12/02/2019   GLUCOSE 83 12/02/2019   BUN 16 12/02/2019   CREATININE 0.81 12/02/2019   CALCIUM 10.0 12/02/2019    Speciality Comments: No specialty comments  available.  Procedures:  No procedures performed Allergies: Lactose, Lactose intolerance (gi), and Ceclor [cefaclor]   Assessment / Plan:     Visit Diagnoses: Bilateral hand pain - Plan: Sedimentation rate, Mutated Citrullinated Vimentin (MCV) Antibody  Bilateral hand pain distribution time is consistent with inflammatory arthropathy across the PIP joints and morning stiffness though there is no peripheral synovitis appreciable on exam today.  Checking sedimentation rate and MCV antibody.  Does not describe typical distribution or associated problems suggestive for neuropathy or impingement.  Positive ANA (antinuclear antibody) - Plan: ANA, RNP Antibody, Anti-Smith antibody, Sjogrens syndrome-A extractable nuclear antibody, Sjogrens  syndrome-B extractable nuclear antibody, Anti-DNA antibody, double-stranded, C3 and C4, Chromatin (Nucleosomal) Antibody  Positive ANA with numerous chronic but mostly nonspecific symptoms.  Does not have objective clinical or serologic criteria at this time.  Will check specific antibody panel as above and serum complements.  If positive results would be suggestive for undifferentiated connective tissue disease.  If entirely negative laboratory testing would recommend observation preferred versus therapeutic drug trial.  Chronic interstitial cystitis  Established diagnosis with extensive prior workup.  No evidence of renal insufficiency or nephritis.  Discussed association with chronic pain syndrome but does not have the allodynia or widespread sensitivity to be strongly suggestive of fibromyalgia syndrome.  Irritable bowel syndrome, unspecified type - Plan: Tissue transglutaminase, IgA, IgG, IgA, IgM  Chronic diarrhea predominant irritable bowel symptoms.  She thinks she had negative celiac disease testing years ago but continues having a a lot of issues and possibly with gluten sensitivity.  Will check for TTG as well as quantitative immunoglobulins  today.  Orders: Orders Placed This Encounter  Procedures   Sedimentation rate   ANA   RNP Antibody   Anti-Smith antibody   Sjogrens syndrome-A extractable nuclear antibody   Sjogrens syndrome-B extractable nuclear antibody   Anti-DNA antibody, double-stranded   C3 and C4   Chromatin (Nucleosomal) Antibody   Mutated Citrullinated Vimentin (MCV) Antibody   Tissue transglutaminase, IgA   IgG, IgA, IgM   Anti-nuclear ab-titer (ANA titer)   No orders of the defined types were placed in this encounter.    Follow-Up Instructions: No follow-ups on file.   Fuller Plan, MD  Note - This record has been created using [employer].  Chart creation errors have been sought, but may not always  have been located. Such creation errors do not reflect on  the standard of medical care.

## 2022-11-24 LAB — IGG, IGA, IGM
IgG (Immunoglobin G), Serum: 996 mg/dL (ref 600–1640)
Immunoglobulin A: 225 mg/dL (ref 47–310)

## 2022-11-26 LAB — ANTI-NUCLEAR AB-TITER (ANA TITER): ANA TITER: 1:40 {titer} — ABNORMAL HIGH

## 2022-11-26 LAB — IGG, IGA, IGM: IgM, Serum: 120 mg/dL (ref 50–300)

## 2022-11-26 LAB — TISSUE TRANSGLUTAMINASE, IGA: (tTG) Ab, IgA: <1 U/mL

## 2022-11-29 LAB — C3 AND C4
C3 Complement: 106 mg/dL (ref 83–193)
C4 Complement: 17 mg/dL (ref 15–57)

## 2022-11-29 LAB — ANA: Anti Nuclear Antibody (ANA): POSITIVE — AB

## 2022-11-29 LAB — ANTI-NUCLEAR AB-TITER (ANA TITER): ANA Titer 1: 1:320 {titer} — ABNORMAL HIGH

## 2022-11-29 LAB — RNP ANTIBODY: Ribonucleic Protein(ENA) Antibody, IgG: <1 AI

## 2022-11-29 LAB — CHROMATIN (NUCLEOSOMAL) ANTIBODY: Chromatin (Nucleosomal) Antibody: <1 AI

## 2022-11-29 LAB — SEDIMENTATION RATE: Sed Rate: 2 mm/h (ref 0–20)

## 2022-11-29 LAB — ANTI-DNA ANTIBODY, DOUBLE-STRANDED: ds DNA Ab: <1 IU/mL

## 2022-11-29 LAB — SJOGRENS SYNDROME-A EXTRACTABLE NUCLEAR ANTIBODY: SSA (Ro) (ENA) Antibody, IgG: <1 AI

## 2022-11-29 LAB — ANTI-SMITH ANTIBODY: ENA SM Ab Ser-aCnc: <1 AI

## 2022-11-29 LAB — SJOGRENS SYNDROME-B EXTRACTABLE NUCLEAR ANTIBODY: SSB (La) (ENA) Antibody, IgG: <1 AI

## 2022-11-29 LAB — MUTATED CITRULLINATED VIMENTIN (MCV) ANTIBODY: MUTATED CITRULLINATED VIMENTIN (MCV) AB: <20 U/mL (ref <20)

## 2022-11-30 ENCOUNTER — Encounter: Payer: Self-pay | Admitting: Internal Medicine

## 2022-11-30 NOTE — Telephone Encounter (Signed)
Patient had labs drawn on 11/23/2022. Please review and advise.

## 2022-12-02 ENCOUNTER — Other Ambulatory Visit: Payer: Self-pay | Admitting: Adult Health

## 2022-12-02 DIAGNOSIS — F411 Generalized anxiety disorder: Secondary | ICD-10-CM

## 2022-12-05 ENCOUNTER — Telehealth: Payer: Self-pay | Admitting: *Deleted

## 2022-12-05 NOTE — Telephone Encounter (Signed)
Patient contacted the office requesting her lab results. Please review results and advise. Thanks!

## 2022-12-05 NOTE — Telephone Encounter (Signed)
Addressed in mychart message to patient today.

## 2022-12-11 ENCOUNTER — Encounter: Payer: Self-pay | Admitting: Adult Health

## 2022-12-11 ENCOUNTER — Ambulatory Visit (INDEPENDENT_AMBULATORY_CARE_PROVIDER_SITE_OTHER): Payer: 59 | Admitting: Adult Health

## 2022-12-11 DIAGNOSIS — G4709 Other insomnia: Secondary | ICD-10-CM | POA: Diagnosis not present

## 2022-12-11 DIAGNOSIS — F900 Attention-deficit hyperactivity disorder, predominantly inattentive type: Secondary | ICD-10-CM | POA: Diagnosis not present

## 2022-12-11 DIAGNOSIS — F411 Generalized anxiety disorder: Secondary | ICD-10-CM

## 2022-12-11 MED ORDER — TEMAZEPAM 15 MG PO CAPS
15.0000 mg | ORAL_CAPSULE | Freq: Every evening | ORAL | 2 refills | Status: DC | PRN
Start: 2022-12-11 — End: 2024-03-07

## 2022-12-11 MED ORDER — FLUOXETINE HCL 10 MG PO CAPS
ORAL_CAPSULE | ORAL | 2 refills | Status: DC
Start: 2022-12-11 — End: 2023-06-01

## 2022-12-11 MED ORDER — LISDEXAMFETAMINE DIMESYLATE 30 MG PO CAPS
30.0000 mg | ORAL_CAPSULE | Freq: Every day | ORAL | 0 refills | Status: DC
Start: 2022-12-11 — End: 2023-06-01

## 2022-12-11 NOTE — Telephone Encounter (Signed)
Patient contacted the office and states she would like a more specific breakdown of the labs drawn on 11/23/2022 that came back as abnormal. Patient states she would like an explanation of what they mean and if she needs to do something else before the six month period. The tests the patient inquired about are the ANA Pattern, ANA Titer, ANA Pattern 1, ANA Titer 1, and her ANA test. Patient call back number is [phone]. Please advise.

## 2022-12-11 NOTE — Progress Notes (Signed)
Jodi Adams [id number] 1986/02/28 37 y.o.  Subjective:   Patient ID:  Jodi Adams is a 37 y.o. (DOB 21-Oct-1985) female.  Chief Complaint: No chief complaint on file.   HPI Jodi Adams presents to the office today for follow-up of GAD and ADHD.   Describes mood today as "about the same". Pleasant. Reports tearfulness. Mood symptoms - reports depression, anxiety, and irritability. Reports worry, rumination, and over thinking. Reports obsessive thoughts and acts. Denies mania. Mood is variable - up and down. Stating "I can't say that I'm feeing any better". Wiling to consider other options. Has tolerated the addition of Prozac and is willing to increase the dose from 20mg  to 30mg . Is also willing to increase dose of Vyvanse from 20mg  to 30mg  daily. Varying interest and motivation. Taking medications as prescribed. Seeing therapist - Jodi Adams. Energy levels lower. Active, does not have a regular exercise routine. Walking. Enjoys some usual interests and activities. Married. Lives with husband of 11 years. Has 69 children 61 year old daughter and  48 year old son. Parents live in [place]. Spending time with friends.  Appetite adequate. Weight loss 134 pounds. Reports ongoing sleeping difficulties. Averages 4 hours. Willing to consider sleep agent.  Focus and concentration difficulties - unable to note any difference with addition of Vyvanse. Mother, father, and brother all ADD. Completing tasks. Managing aspects of household. 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 Denies SI or HI.  Denies AH or VH. Denies self harm. Denies substance use.  Therapist - Jodi Adams.  Previous medication trials:  Zoloft, Lexapro, Adderall  Review of Systems:  Review of Systems  Musculoskeletal:  Negative for gait problem.  Neurological:  Negative for tremors.  Psychiatric/Behavioral:         Please refer to HPI    Medications: I have reviewed the patient's current  medications.  Current Outpatient Medications  Medication Sig Dispense Refill   temazepam (RESTORIL) 15 MG capsule Take 1 capsule (15 mg total) by mouth at bedtime as needed for sleep. 30 capsule 2   acetaminophen (TYLENOL) 325 MG tablet Take 2 tablets (650 mg total) by mouth every 4 (four) hours as needed (for pain scale < 4). 30 tablet 1   FLUoxetine (PROZAC) 10 MG capsule Take 3 capsules daily. 90 capsule 2   ibuprofen (ADVIL) 600 MG tablet Take 1 tablet (600 mg total) by mouth every 6 (six) hours. 30 tablet 0   lisdexamfetamine (VYVANSE) 30 MG capsule Take 1 capsule (30 mg total) by mouth daily. 30 capsule 0   tretinoin (RETIN-A) 0.025 % cream 1 application in the evening to face Externally Pea sized amount once at night for 30 days     No current facility-administered medications for this visit.    Medication Side Effects: None  Allergies:  Allergies  Allergen Reactions   Lactose    Lactose Intolerance (Gi) Diarrhea   Ceclor [Cefaclor] Rash    Past Medical History:  Diagnosis Date   ADD (attention deficit disorder)    Adenomyosis    Anxiety    Chronic nausea    Depression    IBS (irritable bowel syndrome)    ulcers   Interstitial cystitis    Meningitis 2005   encephalitis   Pelvic pain     Past Medical History, Surgical history, Social history, and Family history were reviewed and updated as appropriate.   Please see review of systems for further details on the patient's review from today.   Objective:  Physical Exam:  LMP 11/02/2022   Physical Exam Constitutional:      General: She is not in acute distress. Musculoskeletal:        General: No deformity.  Neurological:     Mental Status: She is alert and oriented to person, place, and time.     Coordination: Coordination normal.  Psychiatric:        Attention and Perception: Attention and perception normal. She does not perceive auditory or visual hallucinations.        Mood and Affect: Mood normal. Mood  is not anxious or depressed. Affect is not labile, blunt, angry or inappropriate.        Speech: Speech normal.        Behavior: Behavior normal.        Thought Content: Thought content normal. Thought content is not paranoid or delusional. Thought content does not include homicidal or suicidal ideation. Thought content does not include homicidal or suicidal plan.        Cognition and Memory: Cognition and memory normal.        Judgment: Judgment normal.     Comments: Insight intact     Lab Review:     Component Value Date/Time   NA 138 12/02/2019 1054   K 4.2 12/02/2019 1054   CL 103 12/02/2019 1054   CO2 30 12/02/2019 1054   GLUCOSE 83 12/02/2019 1054   BUN 16 12/02/2019 1054   CREATININE 0.81 12/02/2019 1054   CALCIUM 10.0 12/02/2019 1054       Component Value Date/Time   WBC 5.0 12/02/2019 1054   RBC 5.14 (H) 12/02/2019 1054   HGB 14.1 12/02/2019 1054   HCT 42.9 12/02/2019 1054   PLT 177.0 12/02/2019 1054   MCV 83.4 12/02/2019 1054   MCH 24.4 (L) 09/16/2019 0633   MCHC 32.8 12/02/2019 1054   RDW 20.6 (H) 12/02/2019 1054   LYMPHSABS 1.9 12/02/2019 1054   MONOABS 0.4 12/02/2019 1054   EOSABS 0.2 12/02/2019 1054   BASOSABS 0.0 12/02/2019 1054    No results found for: "POCLITH", "LITHIUM"   No results found for: "PHENYTOIN", "PHENOBARB", "VALPROATE", "CBMZ"   .res Assessment: Plan:    Plan:  PDMP reviewed  Increase Prozac 20mg  to 30mg  daily Increase Vyvanse 20mg  to 30mg  daily Add Restoril 15mg  at hs for sleep  RTC 4 weeks  Patient advised to contact office with any questions, adverse effects, or acute worsening in signs and symptoms.   Discussed potential benefits, risks, and side effects of stimulants with patient to include increased heart rate, palpitations, insomnia, increased anxiety, increased irritability, or decreased appetite.  Instructed patient to contact office if experiencing any significant tolerability issues.   Diagnoses and all orders for  this visit:  Other insomnia -     temazepam (RESTORIL) 15 MG capsule; Take 1 capsule (15 mg total) by mouth at bedtime as needed for sleep.  Attention deficit hyperactivity disorder (ADHD), predominantly inattentive type -     lisdexamfetamine (VYVANSE) 30 MG capsule; Take 1 capsule (30 mg total) by mouth daily.  Generalized anxiety disorder -     FLUoxetine (PROZAC) 10 MG capsule; Take 3 capsules daily.     Please see After Visit Summary for patient specific instructions.  Future Appointments  Date Time Provider Department Center  01/08/2023  4:40 PM Tasheba Henson, Thereasa Solo, NP [facility] None    No orders of the defined types were placed in this encounter.   -------------------------------

## 2023-01-05 ENCOUNTER — Other Ambulatory Visit: Payer: Self-pay | Admitting: Adult Health

## 2023-01-05 DIAGNOSIS — F411 Generalized anxiety disorder: Secondary | ICD-10-CM

## 2023-01-05 NOTE — Telephone Encounter (Signed)
Has appt 6/10

## 2023-01-08 ENCOUNTER — Ambulatory Visit: Payer: 59 | Admitting: Adult Health

## 2023-06-01 ENCOUNTER — Encounter: Payer: Self-pay | Admitting: Family Medicine

## 2023-06-01 ENCOUNTER — Ambulatory Visit (INDEPENDENT_AMBULATORY_CARE_PROVIDER_SITE_OTHER): Payer: 59 | Admitting: Family Medicine

## 2023-06-01 VITALS — BP 92/60 | HR 85 | Temp 98.2°F | Ht 61.75 in | Wt 136.3 lb

## 2023-06-01 DIAGNOSIS — Z111 Encounter for screening for respiratory tuberculosis: Secondary | ICD-10-CM

## 2023-06-01 DIAGNOSIS — Z1159 Encounter for screening for other viral diseases: Secondary | ICD-10-CM

## 2023-06-01 DIAGNOSIS — Z0000 Encounter for general adult medical examination without abnormal findings: Secondary | ICD-10-CM | POA: Diagnosis not present

## 2023-06-01 DIAGNOSIS — Z833 Family history of diabetes mellitus: Secondary | ICD-10-CM | POA: Diagnosis not present

## 2023-06-01 LAB — CBC WITH DIFFERENTIAL/PLATELET
Basophils Absolute: 0 10*3/uL (ref 0.0–0.1)
Basophils Relative: 0.8 % (ref 0.0–3.0)
Eosinophils Absolute: 0.3 10*3/uL (ref 0.0–0.7)
Eosinophils Relative: 5.9 % — ABNORMAL HIGH (ref 0.0–5.0)
HCT: 44.9 % (ref 36.0–46.0)
Hemoglobin: 14.8 g/dL (ref 12.0–15.0)
Lymphocytes Relative: 32.3 % (ref 12.0–46.0)
Lymphs Abs: 1.6 10*3/uL (ref 0.7–4.0)
MCHC: 33.1 g/dL (ref 30.0–36.0)
MCV: 89.9 fL (ref 78.0–100.0)
Monocytes Absolute: 0.4 10*3/uL (ref 0.1–1.0)
Monocytes Relative: 7.8 % (ref 3.0–12.0)
Neutro Abs: 2.6 10*3/uL (ref 1.4–7.7)
Neutrophils Relative %: 53.2 % (ref 43.0–77.0)
Platelets: 151 10*3/uL (ref 150.0–400.0)
RBC: 5 Mil/uL (ref 3.87–5.11)
RDW: 12.5 % (ref 11.5–15.5)
WBC: 4.9 10*3/uL (ref 4.0–10.5)

## 2023-06-01 LAB — BASIC METABOLIC PANEL
BUN: 8 mg/dL (ref 6–23)
CO2: 27 meq/L (ref 19–32)
Calcium: 9.3 mg/dL (ref 8.4–10.5)
Chloride: 106 meq/L (ref 96–112)
Creatinine, Ser: 0.72 mg/dL (ref 0.40–1.20)
GFR: 108.16 mL/min (ref >60.00)
Glucose, Bld: 91 mg/dL (ref 70–99)
Potassium: 3.8 meq/L (ref 3.5–5.1)
Sodium: 139 meq/L (ref 135–145)

## 2023-06-01 LAB — HEPATIC FUNCTION PANEL
ALT: 18 U/L (ref 0–35)
AST: 17 U/L (ref 0–37)
Albumin: 4.4 g/dL (ref 3.5–5.2)
Alkaline Phosphatase: 71 U/L (ref 39–117)
Bilirubin, Direct: 0.1 mg/dL (ref 0.0–0.3)
Total Bilirubin: 0.5 mg/dL (ref 0.2–1.2)
Total Protein: 6.9 g/dL (ref 6.0–8.3)

## 2023-06-01 LAB — HEMOGLOBIN A1C: Hgb A1c MFr Bld: 5 % (ref 4.6–6.5)

## 2023-06-01 LAB — LIPID PANEL
Cholesterol: 154 mg/dL (ref 0–200)
HDL: 45 mg/dL (ref >39.00)
LDL Cholesterol: 97 mg/dL (ref 0–99)
NonHDL: 108.97
Total CHOL/HDL Ratio: 3
Triglycerides: 59 mg/dL (ref 0.0–149.0)
VLDL: 11.8 mg/dL (ref 0.0–40.0)

## 2023-06-01 LAB — TSH: TSH: 1.51 u[IU]/mL (ref 0.35–5.50)

## 2023-06-01 NOTE — Progress Notes (Signed)
Established Patient Office Visit  Subjective   Patient ID: Jodi Adams, female    DOB: 21-Jun-1986  Age: 37 y.o. MRN: [id number]  Chief Complaint  Patient presents with   Annual Exam    HPI   Jodi Adams is seen today for physical exam.  She is followed by GYN for Pap smears.  Generally doing well.  She has history of chronic interstitial cystitis, endometriosis, adenomyosis, and past history of impaired glucose tolerance.  May be looking at a partial hysterectomy soon.  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  Also is needing tuberculosis screening.  Low risk.  Health maintenance reviewed:  Health Maintenance  Topic Date Due   Hepatitis C Screening  Never done   Cervical Cancer Screening (HPV/Pap Cotest)  10/10/2017   COVID-19 Vaccine (4 - 2023-24 season) 04/01/2023   INFLUENZA VACCINE  10/29/2023 (Originally 03/01/2023)   DTaP/Tdap/Td (2 - Td or Tdap) 06/19/2029   HIV Screening  Completed   HPV VACCINES  Aged Out   -Declines influenza vaccine  Family history-mother has history of COPD and bipolar disorder.  Father has history of multiple kidney stones and type 2 diabetes.  Brother with hypertension.  Maternal grandfather with coronary disease around age 56.  Maternal grandmother died of CAD at age 60.  Social history-she is married and has 2 children.  Never smoked.  Rare alcohol.    Past Medical History:  Diagnosis Date   ADD (attention deficit disorder)    Adenomyosis    Anxiety    Chronic nausea    Depression    IBS (irritable bowel syndrome)    ulcers   Interstitial cystitis    Meningitis 2005   encephalitis   Pelvic pain    Past Surgical History:  Procedure Laterality Date   DIAGNOSTIC LAPAROSCOPY  07/31/2010   negative endo and hysteroscopy   FOOT TENDON SURGERY Right 08/2020   MOHS SURGERY  09/2022   forehead     reports that she has never smoked. She has been exposed to tobacco smoke. She has never used smokeless tobacco. She reports current alcohol use. She reports that she does not use drugs. family history includes Bipolar disorder in her mother; Breast cancer in her maternal grandmother and paternal grandmother; COPD in her mother; Depression in her maternal grandmother; Diabetes in her maternal grandfather; Heart disease in her father and maternal grandfather; Hypertension in her brother, father, and maternal grandfather; Kidney Stones in her father; Other in her mother; Rheum arthritis in her father; Thyroid disease in her father. Allergies  Allergen Reactions   Lactose    Lactose Intolerance (Gi) Diarrhea   Ceclor [Cefaclor] Rash    Review of Systems  Constitutional:  Negative for chills, fever, malaise/fatigue and weight loss.  HENT:  Negative for hearing loss.   Eyes:  Negative for blurred vision and double vision.  Respiratory:  Negative for cough and shortness of breath.   Cardiovascular:  Negative for chest pain, palpitations and leg swelling.  Gastrointestinal:  Negative for abdominal pain, blood in stool, constipation and diarrhea.  Genitourinary:  Negative for dysuria.  Skin:  Negative for rash.  Neurological:  Negative for dizziness, speech change, seizures, loss of consciousness and headaches.  Psychiatric/Behavioral:  Negative for depression.       Objective:     BP 92/60 (BP Location: Left Arm, Patient Position:  Sitting, Cuff Size: Normal)   Pulse 85   Temp 98.2 F (36.8 C) (Oral)   Ht 5' 1.75" (1.568 m)   Wt 136 lb 4.8 oz (61.8 kg)   SpO2 99%   BMI 25.13 kg/m    Physical Exam Vitals reviewed.  Constitutional:      Appearance: She is well-developed.  HENT:     Head: Normocephalic and atraumatic.  Eyes:     Pupils: Pupils are equal, round, and reactive to light.  Neck:     Thyroid: No thyromegaly.  Cardiovascular:     Rate and Rhythm: Normal rate and regular  rhythm.     Heart sounds: Normal heart sounds. No murmur heard. Pulmonary:     Effort: No respiratory distress.     Breath sounds: Normal breath sounds. No wheezing or rales.  Abdominal:     General: Bowel sounds are normal. There is no distension.     Palpations: Abdomen is soft. There is no mass.     Tenderness: There is no abdominal tenderness. There is no guarding or rebound.  Musculoskeletal:        General: Normal range of motion.     Cervical back: Normal range of motion and neck supple.  Lymphadenopathy:     Cervical: No cervical adenopathy.  Skin:    Findings: No rash.  Neurological:     Mental Status: She is alert and oriented to person, place, and time.     Cranial Nerves: No cranial nerve deficit.  Psychiatric:        Behavior: Behavior normal.        Thought Content: Thought content normal.        Judgment: Judgment normal.      No results found for any visits on 06/01/23.    The ASCVD Risk score (Arnett DK, et al., 2019) failed to calculate for the following reasons:   The 2019 ASCVD risk score is only valid for ages 75 to 23    Assessment & Plan:   Problem List Items Addressed This Visit   None Visit Diagnoses     Physical exam    -  Primary   Relevant Orders   Basic metabolic panel   Lipid panel   CBC with Differential/Platelet   TSH   Hepatic function panel   Family history of diabetes mellitus       Relevant Orders   Hemoglobin A1c   Screening-pulmonary TB       Relevant Orders   QuantiFERON-TB Gold Plus   Encounter for hepatitis C screening test for low risk patient       Relevant Orders   Hep C Antibody     Generally healthy 37 year old female.  She has had poorly defined rheumatologic symptoms and is seen rheumatologist with positive ANA but otherwise inconclusive.  -Offered flu vaccine but she declines -Recommend lab work as above. -She plans to continue GYN follow-up for Pap smears  No follow-ups on file.    Evelena Peat,  MD

## 2023-06-02 LAB — HEPATITIS C ANTIBODY: Hepatitis C Ab: NONREACTIVE

## 2023-06-04 ENCOUNTER — Telehealth: Payer: Self-pay | Admitting: Family Medicine

## 2023-06-04 LAB — QUANTIFERON-TB GOLD PLUS
Mitogen-NIL: 8.36 [IU]/mL
NIL: 0.01 [IU]/mL
QuantiFERON-TB Gold Plus: NEGATIVE
TB1-NIL: 0 [IU]/mL
TB2-NIL: 0 [IU]/mL

## 2023-06-04 NOTE — Telephone Encounter (Signed)
Pt called, returning CMA's call. CMA was with a patient. Pt asked that CMA call back at his earliest convenience. 

## 2023-06-04 NOTE — Telephone Encounter (Signed)
Please see result note 

## 2024-03-07 ENCOUNTER — Encounter (HOSPITAL_COMMUNITY): Payer: Self-pay | Admitting: Obstetrics and Gynecology

## 2024-03-07 NOTE — Progress Notes (Signed)
 Spoke w/ via phone for pre-op interview--- pt Lab needs dos----    upt     Lab results------ lab appt 03-11-2024 @ 0930 getting CBC/ T&S/ BMP (gent ordered) COVID test -----patient states asymptomatic no test needed Arrive at ------- 0530 on 03-13-2024 NPO after MN w/ exception sips of water w/ meds Pre-Surgery Ensure or G2: n/a  Med rec completed Medications to take morning of surgery ----- none Diabetic medication ----- n/a  GLP1 agonist last dose: n/a GLP1 instructions:  Patient instructed no nail polish to be worn day of surgery Patient instructed to bring photo id and insurance card day of surgery Patient aware to have Driver (ride ) / caregiver    for 24 hours after surgery - husband, Jodi Adams,will drop-off and come back later after babysitting comes Patient Special Instructions ----- will pick up soap and written instructions at lab appt Pre-Op special Instructions ----- n/a  Patient verbalized understanding of instructions that were given at this phone interview. Patient denies chest pain, sob, fever, cough at the interview.

## 2024-03-07 NOTE — Pre-Procedure Instructions (Signed)
 Surgical Instructions   Your procedure is scheduled on :  Thursday,  03-13-2024. Report to [facility] Main Entrance A at 5:30  A.M., then check in with the Admitting office. Any questions or running late day of surgery: call [phone]  Questions prior to your surgery date: call [phone], Monday-Friday, 8am-4pm. If you experience any cold or flu symptoms such as cough, fever, chills, shortness of breath, etc. between now and your scheduled surgery, please notify your surgeon office.    Remember:  Do not eat any food and do not drink any liquids after midnight the night before your surgery.  This includes no water,  candy,  gum,  and  mints.    Take these medicines the morning of surgery with A SIP OF WATER :  none   May take these medicines IF NEEDED: none    One week prior to surgery, STOP taking any Aspirin (unless otherwise instructed by your surgeon) Aleve, Naproxen, Ibuprofen , Motrin , Advil , Goody's, BC's, all herbal medications, fish oil, and non-prescription vitamins.                     Do NOT Smoke (Tobacco/Vaping) and Do Not drink any alcohol for 24 hours prior to your procedure.  If you use a CPAP at night, you may bring your mask/headgear for your overnight stay.   You will be asked to remove any contacts, glasses, piercing's, hearing aid's, dentures/partials prior to surgery. Please bring cases for these items day of surgery.   Patients discharged the day of surgery will not be allowed to drive home, and someone needs to stay with them for 24 hours.  SURGICAL WAITING ROOM VISITATION Patients may have no more than 2 support people in the waiting area - these visitors may rotate.   Pre-op nurse will coordinate an appropriate time for 1 ADULT support person, who may not rotate, to accompany patient in pre-op.  Children under the age of 43 must have an adult with them who is not the patient and must remain in the main waiting area with an adult.  If the patient  needs to stay at the hospital during part of their recovery, the visitor guidelines for inpatient rooms apply.  Please refer to the [facility] website for the visitor guidelines for any additional information.   If you received a COVID test during your pre-op visit  it is requested that you wear a mask when out in public, stay away from anyone that may not be feeling well and notify your surgeon if you develop symptoms. If you have been in contact with anyone that has tested positive in the last 10 days please notify you surgeon.      Pre-operative CHG Bathing Instructions   You can play a key role in reducing the risk of infection after surgery. Your skin needs to be as free of germs as possible. You can reduce the number of germs on your skin by washing with CHG (chlorhexidine gluconate) soap before surgery. CHG is an antiseptic soap that kills germs and continues to kill germs even after washing.   DO NOT use if you have an allergy to chlorhexidine/CHG or antibacterial soaps. If your skin becomes reddened or irritated, stop using the CHG and notify one of our RN day of surgery.             TAKE A SHOWER THE NIGHT BEFORE SURGERY AND THE DAY OF SURGERY    Please keep in mind the following:  DO NOT shave, including legs and underarms, 48 hours prior to surgery.   You may shave your face before/day of surgery.  Place clean sheets on your bed the night before surgery Use a clean washcloth (not used since being washed) for each shower. DO NOT sleep with pet's night before surgery.  CHG Shower Instructions:  Wash your face and private area with normal soap. If you choose to wash your hair, wash first with your normal shampoo.  After you use shampoo/soap, rinse your hair and body thoroughly to remove shampoo/soap residue.  Turn the water OFF and apply half the bottle of CHG soap to a CLEAN washcloth.  Apply CHG soap ONLY FROM YOUR NECK DOWN TO YOUR TOES (washing for 3-5 minutes)  DO NOT use  CHG soap on face, private areas, open wounds, or sores.  Pay special attention to the area where your surgery is being performed.  If you are having back surgery, having someone wash your back for you may be helpful. Wait 2 minutes after CHG soap is applied, then you may rinse off the CHG soap.  Pat dry with a clean towel  Put on clean pajamas    Additional instructions for the day of surgery: DO NOT APPLY any lotions,  powder,  oils,  deodorants (may use underarm deodorant) , cologne/  perfumes  or makeup Do not wear jewelry /  piercing's/  metal/  permanent jewelry must be removed prior to arrival day of surgery.  (No plastic piercing) Do not wear nail polish, gel polish, artificial nails, or any other type of covering on natural finger nails (toe nails are okay) Do not bring valuables to the hospital. [facility] is not responsible for valuables/personal belongings. Put on clean/comfortable clothes.  Please brush your teeth.  Ask your nurse before applying any prescription medications to the skin.

## 2024-03-11 ENCOUNTER — Encounter (HOSPITAL_COMMUNITY)
Admission: RE | Admit: 2024-03-11 | Discharge: 2024-03-11 | Disposition: A | Discharge status: Home / Self Care | Source: Ambulatory Visit | Attending: Obstetrics and Gynecology | Admitting: Obstetrics and Gynecology

## 2024-03-11 DIAGNOSIS — Z01812 Encounter for preprocedural laboratory examination: Secondary | ICD-10-CM | POA: Diagnosis present

## 2024-03-11 DIAGNOSIS — N939 Abnormal uterine and vaginal bleeding, unspecified: Secondary | ICD-10-CM | POA: Insufficient documentation

## 2024-03-11 DIAGNOSIS — Z01818 Encounter for other preprocedural examination: Secondary | ICD-10-CM

## 2024-03-11 LAB — BASIC METABOLIC PANEL WITH GFR
Anion gap: 10 (ref 5–15)
BUN: 10 mg/dL (ref 6–20)
CO2: 25 mmol/L (ref 22–32)
Calcium: 9.7 mg/dL (ref 8.9–10.3)
Chloride: 104 mmol/L (ref 98–111)
Creatinine, Ser: 0.78 mg/dL (ref 0.44–1.00)
GFR, Estimated: >60 mL/min (ref >60)
Glucose, Bld: 118 mg/dL — ABNORMAL HIGH (ref 70–99)
Potassium: 3.8 mmol/L (ref 3.5–5.1)
Sodium: 139 mmol/L (ref 135–145)

## 2024-03-11 LAB — TYPE AND SCREEN
ABO/RH(D): O POS
Antibody Screen: NEGATIVE

## 2024-03-11 LAB — CBC
HCT: 45.7 % (ref 36.0–46.0)
Hemoglobin: 15.2 g/dL — ABNORMAL HIGH (ref 12.0–15.0)
MCH: 30.2 pg (ref 26.0–34.0)
MCHC: 33.3 g/dL (ref 30.0–36.0)
MCV: 90.9 fL (ref 80.0–100.0)
Platelets: 158 K/uL (ref 150–400)
RBC: 5.03 MIL/uL (ref 3.87–5.11)
RDW: 12.1 % (ref 11.5–15.5)
WBC: 4.1 K/uL (ref 4.0–10.5)
nRBC: 0 % (ref 0.0–0.2)

## 2024-03-11 NOTE — H&P (Signed)
 Jodi Adams is an 38 y.o. female presents today for schedule procedure.   Pertinent Gynecological History: Menses: flow is excessive with use of 4-5 pads or tampons on heaviest days and with severe dysmenorrhea Bleeding: dysfunctional uterine bleeding Contraception: condoms DES exposure: denies Blood transfusions: none Sexually transmitted diseases: no past history Previous GYN Procedures: Laparoscopic myomectomy  Last mammogram: too young Last pap: normal Date: 12/06/23 OB History: G2, P2002   Menstrual History: Menarche age: middle teens Patient's last menstrual period was 03/05/2024 (exact date).    Past Medical History:  Diagnosis Date   Abnormal uterine bleeding (AUB)    ADHD (attention deficit hyperactivity disorder)    Anemia    Chronic interstitial cystitis 2014   Chronic nausea    Depression    Fibromyalgia    rheumatologist-- dr c. rice   GAD (generalized anxiety disorder)    History of basal cell carcinoma (BCC) excision 09/2022   s/p  MOHs forehead and at same time excision left thigh   History of meningitis 2005   encephalitis   Irritable bowel syndrome with diarrhea    Pelvic pain    Thrombocytopenia (HCC)    Uterine fibroid    Wears glasses     Past Surgical History:  Procedure Laterality Date   COLONOSCOPY  2009   DIAGNOSTIC LAPAROSCOPY  2012   in [place] ;   normal findings and D&C  Hysteroscopy   FOOT TENDON SURGERY Right 10/06/2020   @[facility]   by dr christella. [employer] conservation [employer];   repair tendon   MOHS SURGERY  09/2022   forehead    Family History  Problem Relation Age of Onset   Bipolar disorder Mother    COPD Mother    Other Mother        adenomyosis   Hypertension Father    Heart disease Father    Thyroid  disease Father    Kidney Stones Father    Rheum arthritis Father    Hypertension Brother    Breast cancer Maternal Grandmother    Depression Maternal Grandmother        manic   Diabetes Maternal Grandfather    Hypertension Maternal Grandfather     Heart disease Maternal Grandfather    Breast cancer Paternal Grandmother     Social History:  reports that she has never smoked. She has been exposed to tobacco smoke. She has never used smokeless tobacco. She reports current alcohol use. She reports that she does not use drugs.  Allergies:  Allergies  Allergen Reactions   Lactose Intolerance (Gi) Diarrhea   Ceclor [Cefaclor] Rash    No medications prior to admission.    Review of Systems  Constitutional:  Negative for chills and fever.  Respiratory:  Negative for shortness of breath.   Cardiovascular:  Negative for chest pain, palpitations and leg swelling.  Gastrointestinal:  Negative for abdominal pain, nausea and vomiting.  Genitourinary:  Positive for pelvic pain and vaginal bleeding.  Neurological:  Negative for dizziness, weakness and headaches.  Psychiatric/Behavioral:  Negative for suicidal ideas.     Height 5' 1 (1.549 m), weight 59 kg, last menstrual period 03/05/2024. Physical Exam Constitutional *General Appearance: healthy-appearing, well-nourished  Head Head: normocephalic  Neck *ROM normal ROM *Thyroid : no enlargement, non-tender  Lymph Nodes *Palpation: non-tender axillary nodes  Cardiovascular *Auscultation: RRR, no murmur *Peripheral Vascular: no varicosities, no edema  Lungs *Respiratory Effort: no accessory muscle usage, no intercostal retractions *Auscultation: clear to auscultation, no wheezing, no rhonchi Inspection: normal respiratory rate  *Breast Bilateral: no  skin changes, nipple appearance: normal, no tenderness, no masses palpable Right Breast: normal Left Breast: normal  Abdomen *Inspection/Palpation/Auscultation: non-distended, no tenderness, no rebound, no guarding, soft, abdomen: incision laparoscopy  Back Appearance normal Palpation no costovertebral angle tenderness  Female Genitalia - DEFERRED  Extremities Legs: normal Arms: normal Pulses: normal  Neurological  System Impressions: motor: no deficits, sensory: no deficits  Psychiatric *Mood and Affect: active and alert, normal mood Results for orders placed or performed during the hospital encounter of 03/11/24 (from the past 24 hours)  CBC     Status: Abnormal   Collection Time: 03/11/24  9:26 AM  Result Value Ref Range   WBC 4.1 4.0 - 10.5 K/uL   RBC 5.03 3.87 - 5.11 MIL/uL   Hemoglobin 15.2 (H) 12.0 - 15.0 g/dL   HCT 54.2 63.9 - 53.9 %   MCV 90.9 80.0 - 100.0 fL   MCH 30.2 26.0 - 34.0 pg   MCHC 33.3 30.0 - 36.0 g/dL   RDW 87.8 88.4 - 84.4 %   Platelets 158 150 - 400 K/uL   nRBC 0.0 0.0 - 0.2 %  Type and screen     Status: None   Collection Time: 03/11/24  9:26 AM  Result Value Ref Range   ABO/RH(D) O POS    Antibody Screen NEG    Sample Expiration 03/25/2024,2359    Extend sample reason      NO TRANSFUSIONS OR PREGNANCY IN THE PAST 3 MONTHS Performed at [facility], [address] [place], [place] [address]   Basic metabolic panel     Status: Abnormal   Collection Time: 03/11/24  9:26 AM  Result Value Ref Range   Sodium 139 135 - 145 mmol/L   Potassium 3.8 3.5 - 5.1 mmol/L   Chloride 104 98 - 111 mmol/L   CO2 25 22 - 32 mmol/L   Glucose, Bld 118 (H) 70 - 99 mg/dL   BUN 10 6 - 20 mg/dL   Creatinine, Ser 9.21 0.44 - 1.00 mg/dL   Calcium 9.7 8.9 - 89.6 mg/dL   GFR, Estimated >39 >39 mL/min   Anion gap 10 5 - 15    No results found. Uterus 7.4x5.7x4.6cm, EMS 12.17mm, retroverted, bulkiness at fundis c/w adeno, BL ov WNL, Lt ov CLC, rest WNL EMBx - proliferative endometrium without evidence of atypia or hyperplasia  Assessment/Plan: 38yo G2P2002 on no contraception presenting for surgical mgmt of AUB-HMB, dysmenorrhea with h/o thrombocytopenia. TVUS shows possible Adeno.  Good decensus on pelvic exam, pelvis proven to 7lb10z, only one lap surgery. Good candidate for TVH/cysto with possible BS. Failed medical mgmt in terms of COC, TXA. Satisfied fertility.  Risks of TVH  include infection of the uterus, pelvic organs, or skin, inadvertent injury to internal organs, such as bowel or bladder. If there is major injury, extensive surgery may be required. If injury is minor, it may be treated with relative ease. Discussed possibility of excessive blood loss and transfusion. Patient aware that no future fertility will remain after procedure. Patient accepts the possibility of blood transfusion, if necessary. Bowel and/or bladder injury may require prolonged inpatient stay and possible colostomy, Foley catheter, etc, as deemed fit by other surgeon. Patient understands and agrees to move forward with surgery. Aware of possibility of converting to open procedure as well.   Jodi Adams 03/11/2024, 3:19 PM

## 2024-03-12 NOTE — Anesthesia Preprocedure Evaluation (Addendum)
 Anesthesia Evaluation  Patient identified by MRN, date of birth, ID band Patient awake    Reviewed: Allergy & Precautions, NPO status , Patient's Chart, lab work & pertinent test results  History of Anesthesia Complications (+) PONV and history of anesthetic complications  Airway Mallampati: III  TM Distance: >3 FB Neck ROM: Full    Dental  (+) Dental Advisory Given   Pulmonary neg pulmonary ROS   Pulmonary exam normal breath sounds clear to auscultation       Cardiovascular negative cardio ROS  Rhythm:Regular Rate:Normal     Neuro/Psych neg Seizures PSYCHIATRIC DISORDERS (ADHD) Anxiety Depression    H/o meningitis 2005    GI/Hepatic negative GI ROS, Neg liver ROS,,,  Endo/Other  negative endocrine ROS    Renal/GU negative Renal ROS     Musculoskeletal  (+)  Fibromyalgia -  Abdominal   Peds  Hematology negative hematology ROS (+) Lab Results      Component                Value               Date                      WBC                      4.1                 03/11/2024                HGB                      15.2 (H)            03/11/2024                HCT                      45.7                03/11/2024                MCV                      90.9                03/11/2024                PLT                      158                 03/11/2024              Anesthesia Other Findings   Reproductive/Obstetrics                              Anesthesia Physical Anesthesia Plan  ASA: 2  Anesthesia Plan: General   Post-op Pain Management: Tylenol  PO (pre-op)* and Toradol  IV (intra-op)*   Induction: Intravenous  PONV Risk Score and Plan: 3 and Ondansetron , Dexamethasone , Midazolam , Scopolamine  patch - Pre-op, Treatment may vary due to age or medical condition, Propofol  infusion and TIVA  Airway Management Planned: Oral ETT  Additional Equipment:   Intra-op Plan:    Post-operative Plan: Extubation in OR  Informed Consent: I have reviewed  the patients History and Physical, chart, labs and discussed the procedure including the risks, benefits and alternatives for the proposed anesthesia with the patient or authorized representative who has indicated his/her understanding and acceptance.     Dental advisory given  Plan Discussed with: CRNA and Anesthesiologist  Anesthesia Plan Comments: (Risks of general anesthesia discussed including, but not limited to, sore throat, hoarse voice, chipped/damaged teeth, injury to vocal cords, nausea and vomiting, allergic reactions, lung infection, heart attack, stroke, and death. All questions answered.  )         Anesthesia Quick Evaluation

## 2024-03-13 ENCOUNTER — Encounter (HOSPITAL_COMMUNITY): Payer: Self-pay | Admitting: Obstetrics and Gynecology

## 2024-03-13 ENCOUNTER — Ambulatory Visit (HOSPITAL_COMMUNITY): Payer: Self-pay | Admitting: Anesthesiology

## 2024-03-13 ENCOUNTER — Encounter (HOSPITAL_COMMUNITY)
Admission: RE | Disposition: A | Discharge status: Home / Self Care | Payer: Self-pay | Source: Home / Self Care | Attending: Obstetrics and Gynecology

## 2024-03-13 ENCOUNTER — Ambulatory Visit (HOSPITAL_BASED_OUTPATIENT_CLINIC_OR_DEPARTMENT_OTHER): Payer: Self-pay | Admitting: Anesthesiology

## 2024-03-13 ENCOUNTER — Ambulatory Visit (HOSPITAL_COMMUNITY)
Admission: RE | Admit: 2024-03-13 | Discharge: 2024-03-14 | Disposition: A | Discharge status: Home / Self Care | Attending: Obstetrics and Gynecology | Admitting: Obstetrics and Gynecology

## 2024-03-13 ENCOUNTER — Other Ambulatory Visit: Payer: Self-pay

## 2024-03-13 DIAGNOSIS — N939 Abnormal uterine and vaginal bleeding, unspecified: Secondary | ICD-10-CM | POA: Diagnosis present

## 2024-03-13 DIAGNOSIS — M797 Fibromyalgia: Secondary | ICD-10-CM | POA: Diagnosis not present

## 2024-03-13 DIAGNOSIS — Z01818 Encounter for other preprocedural examination: Secondary | ICD-10-CM

## 2024-03-13 HISTORY — DX: Abnormal uterine and vaginal bleeding, unspecified: N93.9

## 2024-03-13 HISTORY — DX: Anemia, unspecified: D64.9

## 2024-03-13 HISTORY — DX: Attention-deficit hyperactivity disorder, unspecified type: F90.9

## 2024-03-13 HISTORY — PX: CYSTOSCOPY: SHX5120

## 2024-03-13 HISTORY — PX: HYSTERECTOMY, VAGINAL, WITH SALPINGECTOMY: SHX7588

## 2024-03-13 HISTORY — DX: Irritable bowel syndrome with diarrhea: K58.0

## 2024-03-13 HISTORY — DX: Thrombocytopenia, unspecified: D69.6

## 2024-03-13 HISTORY — DX: Leiomyoma of uterus, unspecified: D25.9

## 2024-03-13 HISTORY — DX: Generalized anxiety disorder: F41.1

## 2024-03-13 HISTORY — DX: Fibromyalgia: M79.7

## 2024-03-13 HISTORY — DX: Presence of spectacles and contact lenses: Z97.3

## 2024-03-13 HISTORY — DX: Other specified postprocedural states: Z98.890

## 2024-03-13 LAB — POCT PREGNANCY, URINE: Preg Test, Ur: NEGATIVE

## 2024-03-13 SURGERY — HYSTERECTOMY, VAGINAL, WITH SALPINGECTOMY
Anesthesia: General | Site: Vagina

## 2024-03-13 MED ORDER — FLUORESCEIN SODIUM 10 % IV SOLN
INTRAVENOUS | Status: DC | PRN
  Administered 2024-03-13: 20 mg via INTRAVENOUS

## 2024-03-13 MED ORDER — METOCLOPRAMIDE HCL 5 MG/ML IJ SOLN
INTRAMUSCULAR | Status: AC
  Filled 2024-03-13: qty 2

## 2024-03-13 MED ORDER — SCOPOLAMINE 1 MG/3DAYS TD PT72
MEDICATED_PATCH | TRANSDERMAL | Status: AC
Start: 2024-03-13 — End: 2024-03-13
  Filled 2024-03-13: qty 1

## 2024-03-13 MED ORDER — PROPOFOL 10 MG/ML IV BOLUS
INTRAVENOUS | Status: AC
  Filled 2024-03-13: qty 20

## 2024-03-13 MED ORDER — SODIUM CHLORIDE (PF) 0.9 % IJ SOLN
INTRAMUSCULAR | Status: AC
Start: 2024-03-13 — End: 2024-03-13
  Filled 2024-03-13: qty 100

## 2024-03-13 MED ORDER — OXYCODONE HCL 5 MG PO TABS
ORAL_TABLET | ORAL | Status: AC
  Filled 2024-03-13: qty 1

## 2024-03-13 MED ORDER — MENTHOL 3 MG MT LOZG: 1.0000 | LOZENGE | OROMUCOSAL | Status: DC | PRN

## 2024-03-13 MED ORDER — METOCLOPRAMIDE HCL 5 MG/ML IJ SOLN
INTRAMUSCULAR | Status: DC | PRN
  Administered 2024-03-13: 10 mg via INTRAVENOUS

## 2024-03-13 MED ORDER — KETOROLAC TROMETHAMINE 30 MG/ML IJ SOLN
INTRAMUSCULAR | Status: DC | PRN
Start: 2024-03-13 — End: 2024-03-13
  Administered 2024-03-13: 30 mg via INTRAVENOUS

## 2024-03-13 MED ORDER — FENTANYL CITRATE (PF) 250 MCG/5ML IJ SOLN
INTRAMUSCULAR | Status: AC
  Filled 2024-03-13: qty 5

## 2024-03-13 MED ORDER — IBUPROFEN 800 MG PO TABS: 800.0000 mg | ORAL_TABLET | Freq: Three times a day (TID) | ORAL | 1 refills | Status: AC | PRN

## 2024-03-13 MED ORDER — ONDANSETRON HCL 4 MG/2ML IJ SOLN
4.0000 mg | Freq: Four times a day (QID) | INTRAMUSCULAR | Status: DC | PRN
  Administered 2024-03-13 (×2): 4 mg via INTRAVENOUS
  Filled 2024-03-13: qty 2

## 2024-03-13 MED ORDER — ACETAMINOPHEN 500 MG PO TABS
ORAL_TABLET | ORAL | Status: AC
  Filled 2024-03-13: qty 2

## 2024-03-13 MED ORDER — AMISULPRIDE (ANTIEMETIC) 5 MG/2ML IV SOLN
10.0000 mg | Freq: Once | INTRAVENOUS | Status: AC | PRN
  Administered 2024-03-13: 10 mg via INTRAVENOUS

## 2024-03-13 MED ORDER — SIMETHICONE 80 MG PO CHEW: 80.0000 mg | CHEWABLE_TABLET | Freq: Four times a day (QID) | ORAL | Status: DC | PRN

## 2024-03-13 MED ORDER — VASOPRESSIN 20 UNIT/ML IV SOLN
INTRAVENOUS | Status: AC
Start: 2024-03-13 — End: 2024-03-13
  Filled 2024-03-13: qty 1

## 2024-03-13 MED ORDER — ACETAMINOPHEN 500 MG PO TABS
1000.0000 mg | ORAL_TABLET | ORAL | Status: AC
  Administered 2024-03-13: 1000 mg via ORAL

## 2024-03-13 MED ORDER — KETOROLAC TROMETHAMINE 30 MG/ML IJ SOLN
INTRAMUSCULAR | Status: AC
  Filled 2024-03-13: qty 1

## 2024-03-13 MED ORDER — OXYCODONE HCL 5 MG/5ML PO SOLN: 5.0000 mg | Freq: Once | ORAL | Status: AC | PRN

## 2024-03-13 MED ORDER — ROCURONIUM BROMIDE 10 MG/ML (PF) SYRINGE
PREFILLED_SYRINGE | INTRAVENOUS | Status: AC
  Filled 2024-03-13: qty 10

## 2024-03-13 MED ORDER — HYDROMORPHONE HCL 1 MG/ML IJ SOLN
INTRAMUSCULAR | Status: AC
  Filled 2024-03-13: qty 1

## 2024-03-13 MED ORDER — FENTANYL CITRATE (PF) 250 MCG/5ML IJ SOLN
INTRAMUSCULAR | Status: DC | PRN
  Administered 2024-03-13 (×3): 50 ug via INTRAVENOUS
  Administered 2024-03-13: 100 ug via INTRAVENOUS

## 2024-03-13 MED ORDER — DEXAMETHASONE SODIUM PHOSPHATE 10 MG/ML IJ SOLN
INTRAMUSCULAR | Status: DC | PRN
  Administered 2024-03-13: 10 mg via INTRAVENOUS

## 2024-03-13 MED ORDER — MIDAZOLAM HCL 2 MG/2ML IJ SOLN
INTRAMUSCULAR | Status: AC
Start: 2024-03-13 — End: 2024-03-13
  Filled 2024-03-13: qty 2

## 2024-03-13 MED ORDER — KETOROLAC TROMETHAMINE 30 MG/ML IJ SOLN
30.0000 mg | Freq: Four times a day (QID) | INTRAMUSCULAR | Status: DC
  Administered 2024-03-13 – 2024-03-14 (×3): 30 mg via INTRAVENOUS
  Filled 2024-03-13 (×5): qty 1

## 2024-03-13 MED ORDER — DEXAMETHASONE SODIUM PHOSPHATE 10 MG/ML IJ SOLN
INTRAMUSCULAR | Status: AC
  Filled 2024-03-13: qty 1

## 2024-03-13 MED ORDER — LIDOCAINE 2% (20 MG/ML) 5 ML SYRINGE
INTRAMUSCULAR | Status: AC
  Filled 2024-03-13: qty 5

## 2024-03-13 MED ORDER — OXYCODONE HCL 5 MG PO TABS: 5.0000 mg | ORAL_TABLET | Freq: Four times a day (QID) | ORAL | 0 refills | Status: AC | PRN

## 2024-03-13 MED ORDER — DOCUSATE SODIUM 100 MG PO CAPS
100.0000 mg | ORAL_CAPSULE | Freq: Two times a day (BID) | ORAL | Status: DC
  Administered 2024-03-13 – 2024-03-14 (×3): 100 mg via ORAL
  Filled 2024-03-13 (×3): qty 1

## 2024-03-13 MED ORDER — TRANEXAMIC ACID-NACL 1000-0.7 MG/100ML-% IV SOLN
1000.0000 mg | Freq: Once | INTRAVENOUS | Status: AC
  Administered 2024-03-13: 1000 mg via INTRAVENOUS
  Filled 2024-03-13: qty 100

## 2024-03-13 MED ORDER — BUPIVACAINE HCL (PF) 0.25 % IJ SOLN
INTRAMUSCULAR | Status: AC
  Filled 2024-03-13: qty 30

## 2024-03-13 MED ORDER — PHENYLEPHRINE 80 MCG/ML (10ML) SYRINGE FOR IV PUSH (FOR BLOOD PRESSURE SUPPORT)
PREFILLED_SYRINGE | INTRAVENOUS | Status: AC
  Filled 2024-03-13: qty 10

## 2024-03-13 MED ORDER — EPHEDRINE 5 MG/ML INJ
INTRAVENOUS | Status: AC
Start: 2024-03-13 — End: 2024-03-13
  Filled 2024-03-13: qty 5

## 2024-03-13 MED ORDER — HYDROMORPHONE HCL 1 MG/ML IJ SOLN
0.2500 mg | INTRAMUSCULAR | Status: DC | PRN
  Administered 2024-03-13 (×6): 0.25 mg via INTRAVENOUS

## 2024-03-13 MED ORDER — SODIUM CHLORIDE (PF) 0.9 % IJ SOLN
INTRAMUSCULAR | Status: AC
  Filled 2024-03-13: qty 10

## 2024-03-13 MED ORDER — OXYCODONE HCL 5 MG PO TABS
5.0000 mg | ORAL_TABLET | Freq: Once | ORAL | Status: AC | PRN
  Administered 2024-03-13: 5 mg via ORAL

## 2024-03-13 MED ORDER — FENTANYL CITRATE (PF) 100 MCG/2ML IJ SOLN
25.0000 ug | INTRAMUSCULAR | Status: DC | PRN
  Administered 2024-03-13 (×4): 25 ug via INTRAVENOUS

## 2024-03-13 MED ORDER — FENTANYL CITRATE (PF) 100 MCG/2ML IJ SOLN
INTRAMUSCULAR | Status: AC
Start: 2024-03-13 — End: 2024-03-13
  Filled 2024-03-13: qty 2

## 2024-03-13 MED ORDER — POVIDONE-IODINE 10 % EX SWAB
2.0000 | Freq: Once | CUTANEOUS | Status: AC
  Administered 2024-03-13: 2 via TOPICAL

## 2024-03-13 MED ORDER — DIPHENHYDRAMINE HCL 25 MG PO CAPS
25.0000 mg | ORAL_CAPSULE | ORAL | Status: DC | PRN
  Administered 2024-03-13: 25 mg via ORAL
  Filled 2024-03-13: qty 1

## 2024-03-13 MED ORDER — IBUPROFEN 600 MG PO TABS: 600.0000 mg | ORAL_TABLET | Freq: Four times a day (QID) | ORAL | Status: DC

## 2024-03-13 MED ORDER — GENTAMICIN SULFATE 40 MG/ML IJ SOLN
300.0000 mg | INTRAVENOUS | Status: AC
  Administered 2024-03-13: 300 mg via INTRAVENOUS
  Filled 2024-03-13: qty 7.5

## 2024-03-13 MED ORDER — SUGAMMADEX SODIUM 200 MG/2ML IV SOLN
INTRAVENOUS | Status: DC | PRN
Start: 2024-03-13 — End: 2024-03-13
  Administered 2024-03-13: 200 mg via INTRAVENOUS

## 2024-03-13 MED ORDER — DEXMEDETOMIDINE HCL IN NACL 80 MCG/20ML IV SOLN
INTRAVENOUS | Status: AC
  Filled 2024-03-13: qty 20

## 2024-03-13 MED ORDER — LIDOCAINE 2% (20 MG/ML) 5 ML SYRINGE
INTRAMUSCULAR | Status: DC | PRN
  Administered 2024-03-13 (×2): 100 mg via INTRAVENOUS

## 2024-03-13 MED ORDER — POLYETHYLENE GLYCOL 3350 17 G PO PACK: 17.0000 g | PACK | Freq: Every day | ORAL | Status: DC | PRN

## 2024-03-13 MED ORDER — OXYCODONE HCL 5 MG PO TABS
5.0000 mg | ORAL_TABLET | ORAL | Status: DC | PRN
  Administered 2024-03-13 – 2024-03-14 (×3): 10 mg via ORAL
  Filled 2024-03-13 (×2): qty 2
  Filled 2024-03-13: qty 1
  Filled 2024-03-13: qty 2

## 2024-03-13 MED ORDER — LACTATED RINGERS IV SOLN: INTRAVENOUS | Status: DC

## 2024-03-13 MED ORDER — AMISULPRIDE (ANTIEMETIC) 5 MG/2ML IV SOLN
INTRAVENOUS | Status: AC
Start: 2024-03-13 — End: 2024-03-13
  Filled 2024-03-13: qty 4

## 2024-03-13 MED ORDER — BUPIVACAINE HCL (PF) 0.5 % IJ SOLN
INTRAMUSCULAR | Status: AC
Start: 2024-03-13 — End: 2024-03-13
  Filled 2024-03-13: qty 30

## 2024-03-13 MED ORDER — MIDAZOLAM HCL 2 MG/2ML IJ SOLN
INTRAMUSCULAR | Status: AC
  Filled 2024-03-13: qty 2

## 2024-03-13 MED ORDER — ONDANSETRON HCL 4 MG/2ML IJ SOLN
INTRAMUSCULAR | Status: DC | PRN
Start: 2024-03-13 — End: 2024-03-13
  Administered 2024-03-13: 4 mg via INTRAVENOUS

## 2024-03-13 MED ORDER — ACETAMINOPHEN 500 MG PO TABS
1000.0000 mg | ORAL_TABLET | Freq: Four times a day (QID) | ORAL | Status: DC
  Administered 2024-03-13 – 2024-03-14 (×4): 1000 mg via ORAL
  Filled 2024-03-13 (×4): qty 2

## 2024-03-13 MED ORDER — LIDOCAINE 2% (20 MG/ML) 5 ML SYRINGE
INTRAMUSCULAR | Status: AC
Start: 2024-03-13 — End: 2024-03-13
  Filled 2024-03-13: qty 5

## 2024-03-13 MED ORDER — FLUORESCEIN SODIUM 10 % IV SOLN
INTRAVENOUS | Status: AC
  Filled 2024-03-13: qty 5

## 2024-03-13 MED ORDER — PHENYLEPHRINE 80 MCG/ML (10ML) SYRINGE FOR IV PUSH (FOR BLOOD PRESSURE SUPPORT)
PREFILLED_SYRINGE | INTRAVENOUS | Status: DC | PRN
Start: 2024-03-13 — End: 2024-03-13
  Administered 2024-03-13: 40 ug via INTRAVENOUS

## 2024-03-13 MED ORDER — ORAL CARE MOUTH RINSE: 15.0000 mL | Freq: Once | OROMUCOSAL | Status: AC

## 2024-03-13 MED ORDER — SOD CITRATE-CITRIC ACID 500-334 MG/5ML PO SOLN: 30.0000 mL | ORAL | Status: DC

## 2024-03-13 MED ORDER — DIPHENHYDRAMINE HCL 50 MG/ML IJ SOLN: 12.5000 mg | Freq: Once | INTRAMUSCULAR | Status: DC | PRN

## 2024-03-13 MED ORDER — CLINDAMYCIN PHOSPHATE 900 MG/50ML IV SOLN
INTRAVENOUS | Status: AC
Start: 2024-03-13 — End: 2024-03-13
  Filled 2024-03-13: qty 50

## 2024-03-13 MED ORDER — PROPOFOL 500 MG/50ML IV EMUL
INTRAVENOUS | Status: DC | PRN
Start: 2024-03-13 — End: 2024-03-13
  Administered 2024-03-13: 150 mg via INTRAVENOUS
  Administered 2024-03-13: 120 ug/kg/min via INTRAVENOUS

## 2024-03-13 MED ORDER — SODIUM CHLORIDE 0.9 % IR SOLN
Status: DC | PRN
  Administered 2024-03-13: 1000 mL via INTRAVESICAL

## 2024-03-13 MED ORDER — ONDANSETRON HCL 4 MG/2ML IJ SOLN
INTRAMUSCULAR | Status: AC
  Filled 2024-03-13: qty 2

## 2024-03-13 MED ORDER — VASOPRESSIN 20 UNIT/ML IV SOLN
INTRAVENOUS | Status: DC | PRN
  Administered 2024-03-13: 10 mL

## 2024-03-13 MED ORDER — DEXMEDETOMIDINE HCL IN NACL 80 MCG/20ML IV SOLN
INTRAVENOUS | Status: DC | PRN
Start: 2024-03-13 — End: 2024-03-13
  Administered 2024-03-13: 10 ug via INTRAVENOUS

## 2024-03-13 MED ORDER — ONDANSETRON HCL 4 MG PO TABS
4.0000 mg | ORAL_TABLET | Freq: Four times a day (QID) | ORAL | Status: DC | PRN
  Filled 2024-03-13: qty 1

## 2024-03-13 MED ORDER — SCOPOLAMINE 1 MG/3DAYS TD PT72
1.0000 | MEDICATED_PATCH | TRANSDERMAL | Status: DC
  Administered 2024-03-13: 1.5 mg via TRANSDERMAL

## 2024-03-13 MED ORDER — CHLORHEXIDINE GLUCONATE 0.12 % MT SOLN
15.0000 mL | Freq: Once | OROMUCOSAL | Status: AC
  Administered 2024-03-13: 15 mL via OROMUCOSAL

## 2024-03-13 MED ORDER — ROCURONIUM BROMIDE 10 MG/ML (PF) SYRINGE
PREFILLED_SYRINGE | INTRAVENOUS | Status: DC | PRN
  Administered 2024-03-13: 10 mg via INTRAVENOUS
  Administered 2024-03-13: 50 mg via INTRAVENOUS
  Administered 2024-03-13: 10 mg via INTRAVENOUS

## 2024-03-13 MED ORDER — CHLORHEXIDINE GLUCONATE 0.12 % MT SOLN
OROMUCOSAL | Status: AC
  Filled 2024-03-13: qty 15

## 2024-03-13 MED ORDER — MIDAZOLAM HCL 2 MG/2ML IJ SOLN
INTRAMUSCULAR | Status: DC | PRN
  Administered 2024-03-13: 2 mg via INTRAVENOUS

## 2024-03-13 MED ORDER — CLINDAMYCIN PHOSPHATE 900 MG/50ML IV SOLN
900.0000 mg | INTRAVENOUS | Status: AC
  Administered 2024-03-13: 900 mg via INTRAVENOUS

## 2024-03-13 MED ORDER — 0.9 % SODIUM CHLORIDE (POUR BTL) OPTIME
TOPICAL | Status: DC | PRN
  Administered 2024-03-13: 1000 mL

## 2024-03-13 SURGICAL SUPPLY — 25 items
BLADE SURG 15 STRL SS SAFETY (BLADE) ×1 IMPLANT
DRAPE HYSTEROSCOPY (MISCELLANEOUS) ×1 IMPLANT
DRAPE SHEET LG 3/4 BI-LAMINATE (DRAPES) ×1 IMPLANT
ELECTRODE REM PT RTRN 9FT ADLT (ELECTROSURGICAL) ×1 IMPLANT
GAUZE SPONGE 4X4 16PLY XRAY LF (GAUZE/BANDAGES/DRESSINGS) ×2 IMPLANT
GLOVE BIO SURGEON STRL SZ 6 (GLOVE) ×7 IMPLANT
GLOVE BIOGEL PI IND STRL 6.5 (GLOVE) ×7 IMPLANT
GLOVE SURG UNDER POLY LF SZ7 (GLOVE) ×7 IMPLANT
IV NS 1000ML BAXH (IV SOLUTION) ×1 IMPLANT
KIT TURNOVER KIT B (KITS) ×3 IMPLANT
LEGGING LITHOTOMY PAIR STRL (DRAPES) ×3 IMPLANT
NDL HYPO 22X1.5 SAFETY MO (MISCELLANEOUS) IMPLANT
NEEDLE HYPO 22X1.5 SAFETY MO (MISCELLANEOUS) ×3 IMPLANT
NS IRRIG 1000ML POUR BTL (IV SOLUTION) ×3 IMPLANT
PACK ABDOMINAL GYN (CUSTOM PROCEDURE TRAY) ×1 IMPLANT
SET CYSTO W/LG BORE CLAMP LF (SET/KITS/TRAYS/PACK) ×3 IMPLANT
SPIKE FLUID TRANSFER (MISCELLANEOUS) IMPLANT
SPONGE T-LAP 4X18 ~~LOC~~+RFID (SPONGE) ×1 IMPLANT
SUT VIC AB 0 CT1 27XBRD ANBCTR (SUTURE) ×1 IMPLANT
SUT VIC AB 1 CT1 18XBRD ANBCTR (SUTURE) ×6 IMPLANT
SUT VIC AB 2-0 CT1 (SUTURE) ×5 IMPLANT
SUT VICRYL 0 TIES 12 18 (SUTURE) ×3 IMPLANT
SYR CONTROL 10ML LL (SYRINGE) ×1 IMPLANT
TOWEL GREEN STERILE FF (TOWEL DISPOSABLE) ×5 IMPLANT
TRAY FOLEY W/BAG SLVR 14FR (SET/KITS/TRAYS/PACK) ×3 IMPLANT

## 2024-03-13 NOTE — Interval H&P Note (Signed)
 History and Physical Interval Note:  03/13/2024 7:30 AM  Jodi Adams  has presented today for surgery, with the diagnosis of abnormal uterine bleeding.  The various methods of treatment have been discussed with the patient and family. After consideration of risks, benefits and other options for treatment, the patient has consented to  Procedure(s): CYSTOSCOPY (N/A) HYSTERECTOMY, VAGINAL, WITH SALPINGECTOMY as a surgical intervention.  The patient's history has been reviewed, patient examined, no change in status, stable for surgery.  I have reviewed the patient's chart and labs.  Questions were answered to the patient's satisfaction.     Hector Taft M Oretta Berkland

## 2024-03-13 NOTE — Transfer of Care (Signed)
 Immediate Anesthesia Transfer of Care Note  Patient: Jodi Adams  Procedure(s) Performed: CYSTOSCOPY (Bladder) HYSTERECTOMY, VAGINAL, WITH SALPINGECTOMY (Vagina )  Patient Location: PACU  Anesthesia Type:General  Level of Consciousness: alert , oriented, and patient cooperative  Airway & Oxygen Therapy: Patient Spontanous Breathing and Patient connected to nasal cannula oxygen  Post-op Assessment: Report given to RN, Post -op Vital signs reviewed and stable, Patient moving all extremities X 4, and Patient able to stick tongue midline  Post vital signs: Reviewed and stable  Last Vitals:  Vitals Value Taken Time  BP 97/60   Temp 98.3   Pulse 77 03/13/24 09:26  Resp 12 03/13/24 09:26  SpO2 96 % 03/13/24 09:26  Vitals shown include unfiled device data.  Last Pain:  Vitals:   03/13/24 0552  TempSrc: Oral  PainSc: 0-No pain      Patients Stated Pain Goal: 5 (03/13/24 0552)  Complications: No notable events documented.

## 2024-03-13 NOTE — Anesthesia Procedure Notes (Addendum)
 Procedure Name: Intubation Date/Time: 03/13/2024 7:43 AM  Performed by: Viviana Almarie DASEN, CRNAPre-anesthesia Checklist: Patient identified, Emergency Drugs available, Suction available and Patient being monitored Patient Re-evaluated:Patient Re-evaluated prior to induction Oxygen Delivery Method: Circle System Utilized Preoxygenation: Pre-oxygenation with 100% oxygen Induction Type: IV induction Ventilation: Mask ventilation without difficulty Tube type: Oral Number of attempts: 1 Airway Equipment and Method: Stylet, Oral airway and Bite block Placement Confirmation: ETT inserted through vocal cords under direct vision, positive ETCO2 and breath sounds checked- equal and bilateral Secured at: 21 cm Tube secured with: Tape Dental Injury: Teeth and Oropharynx as per pre-operative assessment

## 2024-03-13 NOTE — Op Note (Signed)
 03/13/24  Surgeon: Lavonia Guppy, MD Assist: Krystal Deaner, MD Procedure: Total vaginal hysterectomy; bilateral salpingectomy; cystoscopy Indications: Abnormal uterine bleeding Pre and post-procedure diagnosis: AUB not responsive to medical management  EBL: 270cc IVF: 1700 UOP: 150cc clear yellow urine Specimens: Uterus and cervix, bilateral fallopian tubes Findings:External genitalia WNL. On bimanual exam, retroverted globular 8-9cm uterus. Bilateral adnexa within normal limits - no palpable masses or fluctuance.   Jodi Adams is a 38yo G2P2002 with AUB-HMB. Failed medical management. Desires definitive management in form of hysterectomy.   Consent: Risks of TVH include infection of the uterus, pelvic organs, or skin, inadvertent injury to internal organs, such as bowel or bladder. If there is major injury, extensive surgery may be required. If injury is minor, it may be treated with relative ease. Discussed possibility of excessive blood loss and transfusion. Patient aware that no future fertility will remain after procedure. Patient accepts the possibility of blood transfusion, if necessary. Bowel and/or bladder injury may require prolonged inpatient stay and possible colostomy, Foley catheter, etc, as deemed fit by other surgeon. Patient understands and agrees to move forward with surgery.   Operative technique: Patient was taken to the operating room where she was placed in dorsolithotomy position with adequate padding of all pressure points.  General anesthesia was established without issue.  Preoperative antibiotics were given per guidelines.  She was prepped and draped in normal sterile fashion  A weighted speculum was placed in the posterior fornix with Deaver displacing bladder anteriorly after a Foley catheter was placed.  Using a spinal needle, 10 cc of 20 units of Pitressin and 0.25% Marcaine  in 100 mL normal saline was used to hydrodissect the cervicovaginal junction. A semilunar  incision was made in the mucosa of the vaginal fornix below the attachment of the bladder using Bovie cautery.  The remainder of the bladder was freed from the anterior surface of the uterus by subsequent sharp and blunt dissection at the plica vesicouterine.  After the loose areolar plane was entered, the fascial dissection was completed.  The cervix was then pulled forward and a semilunar incision was made and joined on each of the sides of the cervix and connected to the height of the posterior fornix. The anterior and posterior incisions were then joined on each side of the cervix.  Care was taken to avoid injury to the rectum.  After cutting the mucosa, the uterosacral ligaments were then identified and clamped using Heaney clamps.  They were then cut and transfixed using 0 Vicryl sutures.  Bilateral ligaments were tagged with hemostats.  The bases of the cardinal ligaments were then clamped and cut using Heaney clamps and similarly transfixed.  Similar clamp-cut-suture technique was used until bilateral cornua were reached.  Uterine fundus was then delivered posteriorly via single-tooth tenaculum. Zeppelin clamp was placed across left adnexa which was then Heaney sutured and transfixed, flashing to ensure hemostasis.  This process was repeated on the right adnexa.  The uterus was noted to be adequately mobile and delivered easily through the vagina.  All specimens were then passed off to pathology. Bilateral fallopian tubes were then identified using Babcock clamps. Kelly clamps used to suture ligate via 0-vicryl across mesosalpinx with excellent hemostasis. Using the 0 swedged-on Vicryl suture, bilateral uterosacral ligaments were plicated and remaining tags were tied together for increased apical support.  The vaginal cuff was then closed vertically using 2-0 Vicryl suture in a running locked fashion.  Good hemostasis was confirmed.  Cystoscopy was then carried out.  Fluorescein  was administered by  anesthesia.  The Foley catheter was then removed and 30 degree cystoscope inserted into the urethra.  Upon insertion of scope, positive bubble sign.  No evidence of suture or gross injury or bleeding.  Bilateral efflux of yellow-green tinted urine was noted from both left and right ureteral orifices.  Total fluid instilled into bladder was 200 cc.  The Foley was not replaced at the end of cystoscopy.  All counts correct x2. Patient was awoken in stable condition and taken to PACU.

## 2024-03-13 NOTE — Progress Notes (Signed)
 POD #0  Subjective:  No acute events since surgyer.  Pt denies problems with voiding. Has baseline low BP. Pain now under control but took several meds, feels mildly lightheaded when she's upright. Tolerated PO crackers. Starting to feel more hungry. She has not had flatus. She has not had bowel movement. No VB on pad per pt, husband or RN. Reviewed uncomplicated intra-op course  Objective: Blood pressure (!) 92/56, pulse 78, temperature 97.8 F (36.6 C), temperature source Oral, resp. rate 16, height 5' 1 (1.549 m), weight 59 kg, last menstrual period 03/05/2024, SpO2 99%.  Physical Exam:  General: alert, cooperative and no distress. Good color, AAOx3 Lochia:normal flow Chest: CTAB Heart: RRR no m/r/g Abdomen: +BS, soft, minimal suprapubic TTP GU: clean peripad Extremities: neg edema, neg calf TTP BL, neg Homans BL  Recent Labs    03/11/24 0926  HGB 15.2*  HCT 45.7    Assessment/Plan:  ASSESSMENT: Jodi Adams is a 38 y.o. [id number] s/p TVH/BS/cysto for AUB-HMB. PMHx s/f Anx/Dep not on meds  -Continue PO pain meds -Encourage ambulation and incentive spirometry -Anticipate DC home POD1   LOS: 0 days

## 2024-03-13 NOTE — Anesthesia Postprocedure Evaluation (Signed)
 Anesthesia Post Note  Patient: Jodi Adams  Procedure(s) Performed: CYSTOSCOPY (Bladder) HYSTERECTOMY, VAGINAL, WITH SALPINGECTOMY (Vagina )     Patient location during evaluation: PACU Anesthesia Type: General Level of consciousness: awake Pain management: pain level controlled Vital Signs Assessment: post-procedure vital signs reviewed and stable Respiratory status: spontaneous breathing, nonlabored ventilation and respiratory function stable Cardiovascular status: blood pressure returned to baseline and stable Postop Assessment: no apparent nausea or vomiting Anesthetic complications: no   No notable events documented.  Last Vitals:  Vitals:   03/13/24 1130 03/13/24 1145  BP: 91/67   Pulse: 64 90  Resp: 11 15  Temp:    SpO2: 100% 98%    Last Pain:  Vitals:   03/13/24 1130  TempSrc:   PainSc: 6                  Delon Aisha Arch

## 2024-03-14 ENCOUNTER — Encounter (HOSPITAL_COMMUNITY): Payer: Self-pay | Admitting: Obstetrics and Gynecology

## 2024-03-14 DIAGNOSIS — N939 Abnormal uterine and vaginal bleeding, unspecified: Secondary | ICD-10-CM | POA: Diagnosis not present

## 2024-03-14 LAB — CBC
HCT: 34.7 % — ABNORMAL LOW (ref 36.0–46.0)
Hemoglobin: 11.8 g/dL — ABNORMAL LOW (ref 12.0–15.0)
MCH: 30.4 pg (ref 26.0–34.0)
MCHC: 34 g/dL (ref 30.0–36.0)
MCV: 89.4 fL (ref 80.0–100.0)
Platelets: 137 K/uL — ABNORMAL LOW (ref 150–400)
RBC: 3.88 MIL/uL (ref 3.87–5.11)
RDW: 12.2 % (ref 11.5–15.5)
WBC: 12.9 K/uL — ABNORMAL HIGH (ref 4.0–10.5)
nRBC: 0 % (ref 0.0–0.2)

## 2024-03-14 LAB — SURGICAL PATHOLOGY

## 2024-03-14 NOTE — Progress Notes (Signed)
 Discharge instructions and prescriptions given to pt. Discussed post vaginal hysterectomy care, signs and symptoms to report to the MD, upcoming appointments, ane meds. Pt verbalizes understanding and has no questions or concerns at this time. Pt discharged home from hospital in stable condition.

## 2024-03-14 NOTE — Discharge Summary (Signed)
 Physician Discharge Summary  Patient ID: Jodi Adams MRN: [id number] DOB/AGE: [birth year] 38 y.o.  Admit date: 03/13/2024 Discharge date: 03/14/2024  Admission Diagnoses:  Discharge Diagnoses:  Principal Problem:   Abnormal uterine bleeding (AUB)   Discharged Condition: good  Hospital Course: Admitted for scheduled TVH.BS.cysto for AUB-HMB. Uncomplicated procedure, please see operative note for full details. By POD#1, ambulating, tolerating PO, voiding, pain controlled on PO meds. Discharged home in stable fashion with routine precautions   Consults: None   Discharge Exam: Blood pressure (!) 83/48, pulse 62, temperature 98.2 F (36.8 C), temperature source Oral, resp. rate 18, height 5' 1 (1.549 m), weight 59 kg, last menstrual period 03/05/2024, SpO2 99%. General: alert, cooperative and no distress. Good color, AAOx3 Lochia:normal flow Chest: CTAB Heart: RRR no m/r/g Abdomen: +BS, soft, NTTP GU: clean peripad Extremities: neg edema, neg calf TTP BL, neg Homans BL  Disposition: Discharge disposition: 01-Home or Self Care       Discharge Instructions     Call MD for:  difficulty breathing, headache or visual disturbances   Complete by: As directed    Call MD for:  hives   Complete by: As directed    Call MD for:  persistant dizziness or light-headedness   Complete by: As directed    Call MD for:  persistant nausea and vomiting   Complete by: As directed    Call MD for:  redness, tenderness, or signs of infection (pain, swelling, redness, odor or green/yellow discharge around incision site)   Complete by: As directed    Call MD for:  severe uncontrolled pain   Complete by: As directed    Call MD for:  temperature >100.4   Complete by: As directed    Diet - low sodium heart healthy   Complete by: As directed    Increase activity slowly   Complete by: As directed    Lifting restrictions   Complete by: As directed    No greater than 15lbs for 6wks   May walk  up steps   Complete by: As directed    Sexual Activity Restrictions   Complete by: As directed    None for 6wks      Allergies as of 03/14/2024       Reactions   Lactose Intolerance (gi) Diarrhea   Ceclor [cefaclor] Rash        Medication List     TAKE these medications    ibuprofen  800 MG tablet Commonly known as: ADVIL  Take 1 tablet (800 mg total) by mouth every 8 (eight) hours as needed. What changed: when to take this   oxyCODONE  5 MG immediate release tablet Commonly known as: Oxy IR/ROXICODONE  Take 1 tablet (5 mg total) by mouth every 6 (six) hours as needed for severe pain (pain score 7-10).         Signed: Lavonia HERO Harjot Zavadil 03/14/2024, 8:34 AM

## 2024-03-14 NOTE — Progress Notes (Signed)
 POD #1  Subjective:  No acute events overnight.  Pt denies problems with voiding, ambulating and PO. Pain well controlled this AM. Denies N/V, denies VB. She has had flatus. She has not had bowel movement.   Objective: Blood pressure (!) 83/48, pulse 62, temperature 98.2 F (36.8 C), temperature source Oral, resp. rate 18, height 5' 1 (1.549 m), weight 59 kg, last menstrual period 03/05/2024, SpO2 99%.  Physical Exam:  General: alert, cooperative and no distress. Good color, AAOx3 Lochia:normal flow Chest: CTAB Heart: RRR no m/r/g Abdomen: +BS, soft, NTTP GU: clean peripad Extremities: neg edema, neg calf TTP BL, neg Homans BL  Recent Labs    03/11/24 0926 03/14/24 0648  HGB 15.2* 11.8*  HCT 45.7 34.7*    Assessment/Plan:  ASSESSMENT: Jodi Adams is a 38 y.o. G2P2002 s/p TVH/BS/cysto for AUB-HMB. PMHx s/f Anx/Dep not on meds  -Continue PO pain meds -Encourage ambulation and incentive spirometry -Anticipate DC home this morning. 2wk and 6wk postop f/u planned   LOS: 0 days

## 2024-03-17 ENCOUNTER — Telehealth: Payer: Self-pay

## 2024-03-17 MED ORDER — OSELTAMIVIR PHOSPHATE 75 MG PO CAPS: 75.0000 mg | ORAL_CAPSULE | Freq: Two times a day (BID) | ORAL | 0 refills | Status: AC

## 2024-03-17 NOTE — Telephone Encounter (Signed)
 Copied from CRM #[id number]. Topic: Clinical - Medical Advice >> Mar 17, 2024 12:57 PM Harlene ORN wrote: Reason for CRM: just had a hysterectomy her children has the flu now having flu-like symptoms would like to be presribed tamaflu or any medication that can be used for treatment.

## 2024-03-17 NOTE — Telephone Encounter (Signed)
Patient informed rx was sent 

## 2024-03-17 NOTE — Addendum Note (Signed)
 Addended by: METTA KRISTEN CROME on: 03/17/2024 01:56 PM   Modules accepted: Orders

## 2024-03-17 NOTE — Telephone Encounter (Signed)
 Patient complains of cough, x1 day, Patient reported her daughter tested Positive for flu type B today. I informed patient that visit may be needed before prescribing any medication. Patient inquired if Tamiflu  can be prescribed for ongoing symptoms.
